# Patient Record
Sex: Female | Born: 1960 | Race: White | Hispanic: No | Smoking: Former smoker
Health system: Southern US, Community
[De-identification: ages and names within clinical notes are randomized; demographics above are authoritative.]

## PROBLEM LIST (undated history)

## (undated) DIAGNOSIS — K219 Gastro-esophageal reflux disease without esophagitis: Secondary | ICD-10-CM

## (undated) HISTORY — DX: Gastro-esophageal reflux disease without esophagitis: K21.9

---

## 2014-01-09 ENCOUNTER — Ambulatory Visit (INDEPENDENT_AMBULATORY_CARE_PROVIDER_SITE_OTHER): Payer: BC Managed Care – PPO | Admitting: Family Medicine

## 2014-01-09 ENCOUNTER — Ambulatory Visit: Payer: BC Managed Care – PPO

## 2014-01-09 VITALS — BP 118/72 | HR 73 | Temp 98.6°F | Resp 16 | Ht 64.0 in | Wt 117.6 lb

## 2014-01-09 DIAGNOSIS — L738 Other specified follicular disorders: Secondary | ICD-10-CM

## 2014-01-09 DIAGNOSIS — F17200 Nicotine dependence, unspecified, uncomplicated: Secondary | ICD-10-CM

## 2014-01-09 DIAGNOSIS — H02729 Madarosis of unspecified eye, unspecified eyelid and periocular area: Secondary | ICD-10-CM

## 2014-01-09 DIAGNOSIS — R634 Abnormal weight loss: Secondary | ICD-10-CM

## 2014-01-09 DIAGNOSIS — L853 Xerosis cutis: Secondary | ICD-10-CM

## 2014-01-09 LAB — COMPREHENSIVE METABOLIC PANEL
ALT: 9 U/L (ref 0–35)
AST: 16 U/L (ref 0–37)
Albumin: 4.1 g/dL (ref 3.5–5.2)
Alkaline Phosphatase: 78 U/L (ref 39–117)
BILIRUBIN TOTAL: 0.3 mg/dL (ref 0.2–1.2)
BUN: 15 mg/dL (ref 6–23)
CO2: 30 mEq/L (ref 19–32)
Calcium: 9.3 mg/dL (ref 8.4–10.5)
Chloride: 105 mEq/L (ref 96–112)
Creat: 0.74 mg/dL (ref 0.50–1.10)
Glucose, Bld: 87 mg/dL (ref 70–99)
Potassium: 4.2 mEq/L (ref 3.5–5.3)
Sodium: 142 mEq/L (ref 135–145)
TOTAL PROTEIN: 6.6 g/dL (ref 6.0–8.3)

## 2014-01-09 LAB — POCT URINALYSIS DIPSTICK
Bilirubin, UA: NEGATIVE
GLUCOSE UA: NEGATIVE
Ketones, UA: NEGATIVE
NITRITE UA: NEGATIVE
Protein, UA: NEGATIVE
Spec Grav, UA: 1.025
Urobilinogen, UA: 0.2
pH, UA: 5.5

## 2014-01-09 LAB — POCT UA - MICROSCOPIC ONLY
Casts, Ur, LPF, POC: NEGATIVE
Crystals, Ur, HPF, POC: NEGATIVE
Mucus, UA: POSITIVE
YEAST UA: NEGATIVE

## 2014-01-09 LAB — POCT CBC
Granulocyte percent: 54.4 %G (ref 37–80)
HCT, POC: 44.1 % (ref 37.7–47.9)
HEMOGLOBIN: 14 g/dL (ref 12.2–16.2)
Lymph, poc: 3 (ref 0.6–3.4)
MCH, POC: 31.7 pg — AB (ref 27–31.2)
MCHC: 31.7 g/dL — AB (ref 31.8–35.4)
MCV: 99.8 fL — AB (ref 80–97)
MID (cbc): 0.6 (ref 0–0.9)
MPV: 9.9 fL (ref 0–99.8)
POC GRANULOCYTE: 4.3 (ref 2–6.9)
POC LYMPH PERCENT: 38.4 %L (ref 10–50)
POC MID %: 7.2 % (ref 0–12)
Platelet Count, POC: 288 10*3/uL (ref 142–424)
RBC: 4.42 M/uL (ref 4.04–5.48)
RDW, POC: 12.8 %
WBC: 7.9 10*3/uL (ref 4.6–10.2)

## 2014-01-09 LAB — POCT GLYCOSYLATED HEMOGLOBIN (HGB A1C): Hemoglobin A1C: 5.7

## 2014-01-09 NOTE — Patient Instructions (Addendum)
The remaining labs are still pending. I will try to let she noticed it a few days. If they are all normal just plan to return in about 3 months for another recheck.  I would encourage you to schedule yourself for a screening colonoscopy some time. This can be done by calling [facility] or doctors Elnoria HowardHung and Loreta AveMann  Return sooner if problems  Urged to stop smoking.

## 2014-01-09 NOTE — Progress Notes (Signed)
Subjective: 53 year old lady here for several concerns. She's worried about her thyroid because she looked up things on the Internet. She has had loss of her eyelashes she says. She said it initially on one side, then later the other. She's been having dry skin for a long time. She has heartburn. She also has lost about 60 pounds over the last 3 years. No good reason for that. She is a cigarette smoker, since she was 53 years old. Has not had any major illnesses or surgeries. 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 She gets a lot of exercise there. The almost 60 pound weight loss over the last several years she cannot explain. She got married a few years ago and that's the main change her life. She does have some stress. She has one 53 year old grown daughter. She rarely drinks alcohol but as noted above she does smoke. About 1 pack a day on the average. Review of systems for HEENT, cardiovascular, respiratory, GI, GU, and musculoskeletal are all fairly unremarkable with the exception of a little heartburn that she takes some OTC medication for that she buys for 90 days at [facility] it is probably an H2 blocker and not a PPI.  Objective: Slender lady in no major distress. TMs normal. Eyes PERRLA. Wears contacts. Fundi benign. The eyelashes don't look at him to me. Her throat was clear. Neck supple without nodes thyromegaly. No carotid bruits. Chest clear. Heart rate without murmurs. And soft without mass tenderness. She's never had a colonoscopy. Extremities unremarkable. Breast and pelvic exam not done.   Results for orders placed in visit on 01/09/14  POCT CBC      Result Value Ref Range   WBC 7.9  4.6 - 10.2 K/uL   Lymph, poc 3.0  0.6 - 3.4   POC LYMPH PERCENT 38.4  10 - 50 %L   MID (cbc) 0.6  0 - 0.9   POC MID % 7.2  0 - 12 %M   POC Granulocyte 4.3  2 - 6.9   Granulocyte percent 54.4  37 - 80 %G   RBC 4.42  4.04 - 5.48 M/uL   Hemoglobin 14.0  12.2 - 16.2 g/dL   HCT, POC 16.144.1   09.637.7 - 47.9 %   MCV 99.8 (*) 80 - 97 fL   MCH, POC 31.7 (*) 27 - 31.2 pg   MCHC 31.7 (*) 31.8 - 35.4 g/dL   RDW, POC 04.512.8     Platelet Count, POC 288  142 - 424 K/uL   MPV 9.9  0 - 99.8 fL  POCT UA - MICROSCOPIC ONLY      Result Value Ref Range   WBC, Ur, HPF, POC 1-2     RBC, urine, microscopic 4-7     Bacteria, U Microscopic trace     Mucus, UA positive     Epithelial cells, urine per micros 0-3     Crystals, Ur, HPF, POC neg     Casts, Ur, LPF, POC neg     Yeast, UA neg    POCT URINALYSIS DIPSTICK      Result Value Ref Range   Color, UA yellow     Clarity, UA clear     Glucose, UA neg     Bilirubin, UA neg     Ketones, UA neg     Spec Grav, UA 1.025     Blood, UA moderate     pH, UA 5.5  Protein, UA neg     Urobilinogen, UA 0.2     Nitrite, UA neg     Leukocytes, UA Trace    POCT GLYCOSYLATED HEMOGLOBIN (HGB A1C)      Result Value Ref Range   Hemoglobin A1C 5.7     Assessment: Weight loss Dry skin Eyelash loss Tobacco use disorder  Plan: Ordered labs. We'll check the rest of them before we do anything else. See her back in about 3 months for recheck.

## 2014-01-10 LAB — TSH: TSH: 0.83 u[IU]/mL (ref 0.350–4.500)

## 2014-01-15 ENCOUNTER — Telehealth: Payer: Self-pay | Admitting: Family Medicine

## 2014-01-15 ENCOUNTER — Encounter: Payer: Self-pay | Admitting: *Deleted

## 2014-01-15 DIAGNOSIS — L659 Nonscarring hair loss, unspecified: Secondary | ICD-10-CM

## 2014-01-15 NOTE — Telephone Encounter (Signed)
Patient called regarding lab results. Notified patient labs normal and lab letter sent. She is happy her TSH is normal but she is concerned since that was normal why her eyelashes keep falling out. Please advise?

## 2014-01-16 NOTE — Telephone Encounter (Signed)
Please see my reply.  I may have failed to route to Clinical Pool

## 2014-01-16 NOTE — Telephone Encounter (Signed)
Call: I do not know why they are falling out.  Please avoid picking at them which can make them fall out.  Suggest referral to a dermatologist if she is interested in pursuing this.

## 2014-01-17 NOTE — Telephone Encounter (Signed)
Spoke to pt. [facility]

## 2014-01-17 NOTE — Telephone Encounter (Signed)
Tried to call pt- network error

## 2014-05-19 ENCOUNTER — Emergency Department (HOSPITAL_BASED_OUTPATIENT_CLINIC_OR_DEPARTMENT_OTHER)
Admission: EM | Admit: 2014-05-19 | Discharge: 2014-05-19 | Disposition: A | Discharge status: Home / Self Care | Payer: BC Managed Care – PPO | Attending: Emergency Medicine | Admitting: Emergency Medicine

## 2014-05-19 ENCOUNTER — Encounter (HOSPITAL_BASED_OUTPATIENT_CLINIC_OR_DEPARTMENT_OTHER): Payer: Self-pay | Admitting: Emergency Medicine

## 2014-05-19 DIAGNOSIS — Z87891 Personal history of nicotine dependence: Secondary | ICD-10-CM | POA: Insufficient documentation

## 2014-05-19 DIAGNOSIS — M545 Low back pain, unspecified: Secondary | ICD-10-CM | POA: Insufficient documentation

## 2014-05-19 DIAGNOSIS — Z8719 Personal history of other diseases of the digestive system: Secondary | ICD-10-CM | POA: Insufficient documentation

## 2014-05-19 DIAGNOSIS — R319 Hematuria, unspecified: Secondary | ICD-10-CM

## 2014-05-19 DIAGNOSIS — Z79899 Other long term (current) drug therapy: Secondary | ICD-10-CM | POA: Insufficient documentation

## 2014-05-19 LAB — URINALYSIS, ROUTINE W REFLEX MICROSCOPIC
Bilirubin Urine: NEGATIVE
Glucose, UA: NEGATIVE mg/dL
Ketones, ur: NEGATIVE mg/dL
Leukocytes, UA: NEGATIVE
NITRITE: NEGATIVE
Protein, ur: NEGATIVE mg/dL
Specific Gravity, Urine: 1.022 (ref 1.005–1.030)
UROBILINOGEN UA: 0.2 mg/dL (ref 0.0–1.0)
pH: 6 (ref 5.0–8.0)

## 2014-05-19 LAB — URINE MICROSCOPIC-ADD ON

## 2014-05-19 MED ORDER — IBUPROFEN 800 MG PO TABS: 800.0000 mg | ORAL_TABLET | Freq: Three times a day (TID) | ORAL | Status: DC

## 2014-05-19 MED ORDER — CYCLOBENZAPRINE HCL 10 MG PO TABS: 10.0000 mg | ORAL_TABLET | Freq: Two times a day (BID) | ORAL | Status: DC | PRN

## 2014-05-19 MED ORDER — HYDROCODONE-ACETAMINOPHEN 5-325 MG PO TABS
1.0000 | ORAL_TABLET | ORAL | Status: DC | PRN
Start: 2014-05-19 — End: 2024-01-18

## 2014-05-19 NOTE — ED Provider Notes (Signed)
CSN: [id number]     Arrival date & time 05/19/14  1522 History   First MD Initiated Contact with Patient 05/19/14 1632     Chief Complaint  Patient presents with  . Back Pain     (Consider location/radiation/quality/duration/timing/severity/associated sxs/prior Treatment) Patient is a 53 y.o. female presenting with back pain. The history is provided by the patient. No language interpreter was used.  Back Pain Location:  Lumbar spine Quality:  Stabbing Radiates to:  R thigh Pain severity:  Moderate Onset quality:  Gradual Duration:  2 days Associated symptoms: no abdominal pain, no dysuria, no fever, no headaches and no numbness   Associated symptoms comment:  She complains of right sided lower back pain, worse with movement, better with rest, that started 2 days ago. She reports pain is sharp, stabbing now, especially when she gets up in the morning. No abdominal pain, N, V, fever, dysuria, vaginal symptoms. No known injury to back.   Past Medical History  Diagnosis Date  . GERD (gastroesophageal reflux disease)    History reviewed. No pertinent past surgical history. No family history on file. History  Substance Use Topics  . Smoking status: Former Smoker    Quit date: 04/28/2014  . Smokeless tobacco: Not on file  . Alcohol Use: No   OB History   Grav Para Term Preterm Abortions TAB SAB Ect Mult Living                 Review of Systems  Constitutional: Negative for fever and chills.  Gastrointestinal: Negative.  Negative for nausea, vomiting and abdominal pain.  Genitourinary: Negative.  Negative for dysuria, vaginal bleeding and vaginal discharge.  Musculoskeletal: Positive for back pain.  Skin: Negative.   Neurological: Negative.  Negative for numbness and headaches.      Allergies  Review of patient's allergies indicates no known allergies.  Home Medications   Prior to Admission medications   Medication Sig Start Date End Date Taking? Authorizing Provider   Multiple Vitamin (MULTIVITAMIN) tablet Take 1 tablet by mouth daily.    Historical Provider, MD   BP 114/70  Pulse 85  Temp(Src) 98.4 F (36.9 C) (Oral)  Resp 18  Ht  (1.651 m)  Wt 105 lb (47.628 kg)  BMI 17.47 kg/m2  SpO2 100% Physical Exam  Constitutional: She is oriented to person, place, and time. She appears well-developed and well-nourished. No distress.  HENT:  Head: Normocephalic.  Neck: Normal range of motion. Neck supple.  Cardiovascular: Normal rate and regular rhythm.   Pulmonary/Chest: Effort normal and breath sounds normal.  Abdominal: Soft. Bowel sounds are normal. There is no tenderness. There is no rebound and no guarding.  Musculoskeletal: Normal range of motion. She exhibits no edema.  Mild lumbar and right paralumbar tenderness without swelling or discoloration.  Neurological: She is alert and oriented to person, place, and time. She has normal reflexes. Coordination normal.  Skin: Skin is warm and dry. No rash noted.  Psychiatric: She has a normal mood and affect.    ED Course  Procedures (including critical care time) Labs Review Labs Reviewed  URINALYSIS, ROUTINE W REFLEX MICROSCOPIC - Abnormal; Notable for the following:    Hgb urine dipstick MODERATE (*)    All other components within normal limits  URINE MICROSCOPIC-ADD ON    Imaging Review No results found.   EKG Interpretation None      MDM   Final diagnoses:  None    1. Back pain, muscular 2. Hematuria  Asymptomatic hematuria that does not follow pattern of kidney stone (no flank or abdominal pain, no nausea, worse with movement, better with rest.). Refer to urology for 2 week recheck.  Back pain following muscular pattern. No neurologic deficits. Will treat symptomatically.     Arnoldo Hooker, PA-C 05/19/14 1734  Arnoldo Hooker, PA-C 05/19/14 1734

## 2014-05-19 NOTE — ED Notes (Signed)
Reports pain to back since yesterday. Pt cooks, is on feet a lot. Tenderness with some movement. Denies dysuria.

## 2014-05-19 NOTE — Discharge Instructions (Signed)
Back Exercises Back exercises help treat and prevent back injuries. The goal is to increase your strength in your belly (abdominal) and back muscles. These exercises can also help with flexibility. Start these exercises when told by your doctor. HOME CARE Back exercises include: Pelvic Tilt.  Lie on your back with your knees bent. Tilt your pelvis until the lower part of your back is against the floor. Hold this position 5 to 10 sec. Repeat this exercise 5 to 10 times. Knee to Chest.  Pull 1 knee up against your chest and hold for 20 to 30 seconds. Repeat this with the other knee. This may be done with the other leg straight or bent, whichever feels better. Then, pull both knees up against your chest. Sit-Ups or Curl-Ups.  Bend your knees 90 degrees. Start with tilting your pelvis, and do a partial, slow sit-up. Only lift your upper half 30 to 45 degrees off the floor. Take at least 2 to 3 seonds for each sit-up. Do not do sit-ups with your knees out straight. If partial sit-ups are difficult, simply do the above but with only tightening your belly (abdominal) muscles and holding it as told. Hip-Lift.  Lie on your back with your knees flexed 90 degrees. Push down with your feet and shoulders as you raise your hips 2 inches off the floor. Hold for 10 seconds, repeat 5 to 10 times. Back Arches.  Lie on your stomach. Prop yourself up on bent elbows. Slowly press on your hands, causing an arch in your low back. Repeat 3 to 5 times. Shoulder-Lifts.  Lie face down with arms beside your body. Keep hips and belly pressed to floor as you slowly lift your head and shoulders off the floor. Do not overdo your exercises. Be careful in the beginning. Exercises may cause you some mild back discomfort. If the pain lasts for more than 15 minutes, stop the exercises until you see your doctor. Improvement with exercise for back problems is slow.  Document Released: 10/17/2010 Document Revised: 12/07/2011  Document Reviewed: 07/16/2011 [facility] Patient Information 2015 [place], [place]. This information is not intended to replace advice given to you by your health care provider. Make sure you discuss any questions you have with your health care provider.  Back Injury Prevention Back injuries can be extremely painful and difficult to heal. After having one back injury, you are much more likely to experience another later on. It is important to learn how to avoid injuring or re-injuring your back. The following tips can help you to prevent a back injury. PHYSICAL FITNESS  Exercise regularly and try to develop good tone in your abdominal muscles. Your abdominal muscles provide a lot of the support needed by your back.  Do aerobic exercises (walking, jogging, biking, swimming) regularly.  Do exercises that increase balance and strength (tai chi, yoga) regularly. This can decrease your risk of falling and injuring your back.  Stretch before and after exercising.  Maintain a healthy weight. The more you weigh, the more stress is placed on your back. For every pound of weight, 10 times that amount of pressure is placed on the back. DIET  Talk to your caregiver about how much calcium and vitamin D you need per day. These nutrients help to prevent weakening of the bones (osteoporosis). Osteoporosis can cause broken (fractured) bones that lead to back pain.  Include good sources of calcium in your diet, such as dairy products, green, leafy vegetables, and products with calcium added (fortified).  Include good sources of vitamin D in your diet, such as milk and foods that are fortified with vitamin D. °· Consider taking a nutritional supplement or a multivitamin if needed. °· Stop smoking if you smoke. °POSTURE °· Sit and stand up straight. Avoid leaning forward when you sit or hunching over when you stand. °· Choose chairs with good low back (lumbar) support. °· If you work at a desk, sit close to your work  so you do not need to lean over. Keep your chin tucked in. Keep your neck drawn back and elbows bent at a right angle. Your arms should look like the letter "L." °· Sit high and close to the steering wheel when you drive. Add a lumbar support to your car seat if needed. °· Avoid sitting or standing in one position for too long. Take breaks to get up, stretch, and walk around at least once every hour. Take breaks if you are driving for long periods of time. °· Sleep on your side with your knees slightly bent, or sleep on your back with a pillow under your knees. Do not sleep on your stomach. °LIFTING, TWISTING, AND REACHING °· Avoid heavy lifting, especially repetitive lifting. If you must do heavy lifting: °¨ Stretch before lifting. °¨ Work slowly. °¨ Rest between lifts. °¨ Use carts and dollies to move objects when possible. °¨ Make several small trips instead of carrying 1 heavy load. °¨ Ask for help when you need it. °¨ Ask for help when moving big, awkward objects. °· Follow these steps when lifting: °¨ Stand with your feet shoulder-width apart. °¨ Get as close to the object as you can. Do not try to pick up heavy objects that are far from your body. °¨ Use handles or lifting straps if they are available. °¨ Bend at your knees. Squat down, but keep your heels off the floor. °¨ Keep your shoulders pulled back, your chin tucked in, and your back straight. °¨ Lift the object slowly, tightening the muscles in your legs, abdomen, and buttocks. Keep the object as close to the center of your body as possible. °¨ When you put a load down, use these same guidelines in reverse. °· Do not: °¨ Lift the object above your waist. °¨ Twist at the waist while lifting or carrying a load. Move your feet if you need to turn, not your waist. °¨ Bend over without bending at your knees. °· Avoid reaching over your head, across a table, or for an object on a high surface. °OTHER TIPS °· Avoid wet floors and keep sidewalks clear of ice  to prevent falls. °· Do not sleep on a mattress that is too soft or too hard. °· Keep items that are used frequently within easy reach. °· Put heavier objects on shelves at waist level and lighter objects on lower or higher shelves. °· Find ways to decrease your stress, such as exercise, massage, or relaxation techniques. Stress can build up in your muscles. Tense muscles are more vulnerable to injury. °· Seek treatment for depression or anxiety if needed. These conditions can increase your risk of developing back pain. °SEEK MEDICAL CARE IF: °· You injure your back. °· You have questions about diet, exercise, or other ways to prevent back injuries. °MAKE SURE YOU: °· Understand these instructions. °· Will watch your condition. °· Will get help right away if you are not doing well or get worse. °Document Released: 10/22/2004 Document Revised: 12/07/2011 Document Reviewed: 10/26/2011 °[employer]® Patient Information ©  2015 [employer]. This information is not intended to replace advice given to you by your health care provider. Make sure you discuss any questions you have with your health care provider. Back Pain, Adult Low back pain is very common. About 1 in 5 people have back pain.The cause of low back pain is rarely dangerous. The pain often gets better over time.About half of people with a sudden onset of back pain feel better in just 2 weeks. About 8 in 10 people feel better by 6 weeks.  CAUSES Some common causes of back pain include:  Strain of the muscles or ligaments supporting the spine.  Wear and tear (degeneration) of the spinal discs.  Arthritis.  Direct injury to the back. DIAGNOSIS Most of the time, the direct cause of low back pain is not known.However, back pain can be treated effectively even when the exact cause of the pain is unknown.Answering your caregiver's questions about your overall health and symptoms is one of the most accurate ways to make sure the cause of your pain is  not dangerous. If your caregiver needs more information, he or she may order lab work or imaging tests (X-rays or MRIs).However, even if imaging tests show changes in your back, this usually does not require surgery. HOME CARE INSTRUCTIONS For many people, back pain returns.Since low back pain is rarely dangerous, it is often a condition that people can learn to [facility] their own.   Remain active. It is stressful on the back to sit or stand in one place. Do not sit, drive, or stand in one place for more than 30 minutes at a time. Take short walks on level surfaces as soon as pain allows.Try to increase the length of time you walk each day.  Do not stay in bed.Resting more than 1 or 2 days can delay your recovery.  Do not avoid exercise or work.Your body is made to move.It is not dangerous to be active, even though your back may hurt.Your back will likely heal faster if you return to being active before your pain is gone.  Pay attention to your body when you bend and lift. Many people have less discomfortwhen lifting if they bend their knees, keep the load close to their bodies,and avoid twisting. Often, the most comfortable positions are those that put less stress on your recovering back.  Find a comfortable position to sleep. Use a firm mattress and lie on your side with your knees slightly bent. If you lie on your back, put a pillow under your knees.  Only take over-the-counter or prescription medicines as directed by your caregiver. Over-the-counter medicines to reduce pain and inflammation are often the most helpful.Your caregiver may prescribe muscle relaxant drugs.These medicines help dull your pain so you can more quickly return to your normal activities and healthy exercise.  Put ice on the injured area.  Put ice in a plastic bag.  Place a towel between your skin and the bag.  Leave the ice on for 15-20 minutes, 03-04 times a day for the first 2 to 3 days. After that, ice  and heat may be alternated to reduce pain and spasms.  Ask your caregiver about trying back exercises and gentle massage. This may be of some benefit.  Avoid feeling anxious or stressed.Stress increases muscle tension and can worsen back pain.It is important to recognize when you are anxious or stressed and learn ways to manage it.Exercise is a great option. SEEK MEDICAL CARE IF:  You have  pain that is not relieved with rest or medicine.  You have pain that does not improve in 1 week.  You have new symptoms.  You are generally not feeling well. SEEK IMMEDIATE MEDICAL CARE IF:   You have pain that radiates from your back into your legs.  You develop new bowel or bladder control problems.  You have unusual weakness or numbness in your arms or legs.  You develop nausea or vomiting.  You develop abdominal pain.  You feel faint. Document Released: 09/14/2005 Document Revised: 03/15/2012 Document Reviewed: 01/16/2014 [facility] Patient Information 2015 [place], [place]. This information is not intended to replace advice given to you by your health care provider. Make sure you discuss any questions you have with your health care provider. Hematuria Hematuria is blood in your urine. It can be caused by a bladder infection, kidney infection, prostate infection, kidney stone, or cancer of your urinary tract. Infections can usually be treated with medicine, and a kidney stone usually will pass through your urine. If neither of these is the cause of your hematuria, further workup to find out the reason may be needed. It is very important that you tell your health care provider about any blood you see in your urine, even if the blood stops without treatment or happens without causing pain. Blood in your urine that happens and then stops and then happens again can be a symptom of a very serious condition. Also, pain is not a symptom in the initial stages of many urinary cancers. HOME CARE  INSTRUCTIONS   Drink lots of fluid, 3-4 quarts a day. If you have been diagnosed with an infection, cranberry juice is especially recommended, in addition to large amounts of water.  Avoid caffeine, tea, and carbonated beverages because they tend to irritate the bladder.  Avoid alcohol because it may irritate the prostate.  Take all medicines as directed by your health care provider.  If you were prescribed an antibiotic medicine, finish it all even if you start to feel better.  If you have been diagnosed with a kidney stone, follow your health care provider's instructions regarding straining your urine to catch the stone.  Empty your bladder often. Avoid holding urine for long periods of time.  After a bowel movement, women should cleanse front to back. Use each tissue only once.  Empty your bladder before and after sexual intercourse if you are a female. SEEK MEDICAL CARE IF:  You develop back pain.  You have a fever.  You have a feeling of sickness in your stomach (nausea) or vomiting.  Your symptoms are not better in 3 days. Return sooner if you are getting worse. SEEK IMMEDIATE MEDICAL CARE IF:   You develop severe vomiting and are unable to keep the medicine down.  You develop severe back or abdominal pain despite taking your medicines.  You begin passing a large amount of blood or clots in your urine.  You feel extremely weak or faint, or you pass out. MAKE SURE YOU:   Understand these instructions.  Will watch your condition.  Will get help right away if you are not doing well or get worse. Document Released: 09/14/2005 Document Revised: 01/29/2014 Document Reviewed: 05/15/2013 [facility] Patient Information 2015 [place], [place]. This information is not intended to replace advice given to you by your health care provider. Make sure you discuss any questions you have with your health care provider.

## 2014-05-19 NOTE — ED Notes (Signed)
Pt discharged to home NAD.  

## 2014-05-20 NOTE — ED Provider Notes (Signed)
  Medical screening examination/treatment/procedure(s) were performed by non-physician practitioner and as supervising physician I was immediately available for consultation/collaboration.   EKG Interpretation None         Gerhard Munch, MD 05/20/14 2124

## 2014-08-16 IMAGING — CR DG CHEST 2V
2 series · 2 of 2 positions shown · non-contrast
Comparison: None.

CLINICAL DATA: Smoker, gastroesophageal reflux

EXAM:
CHEST  2 VIEW

[PA]
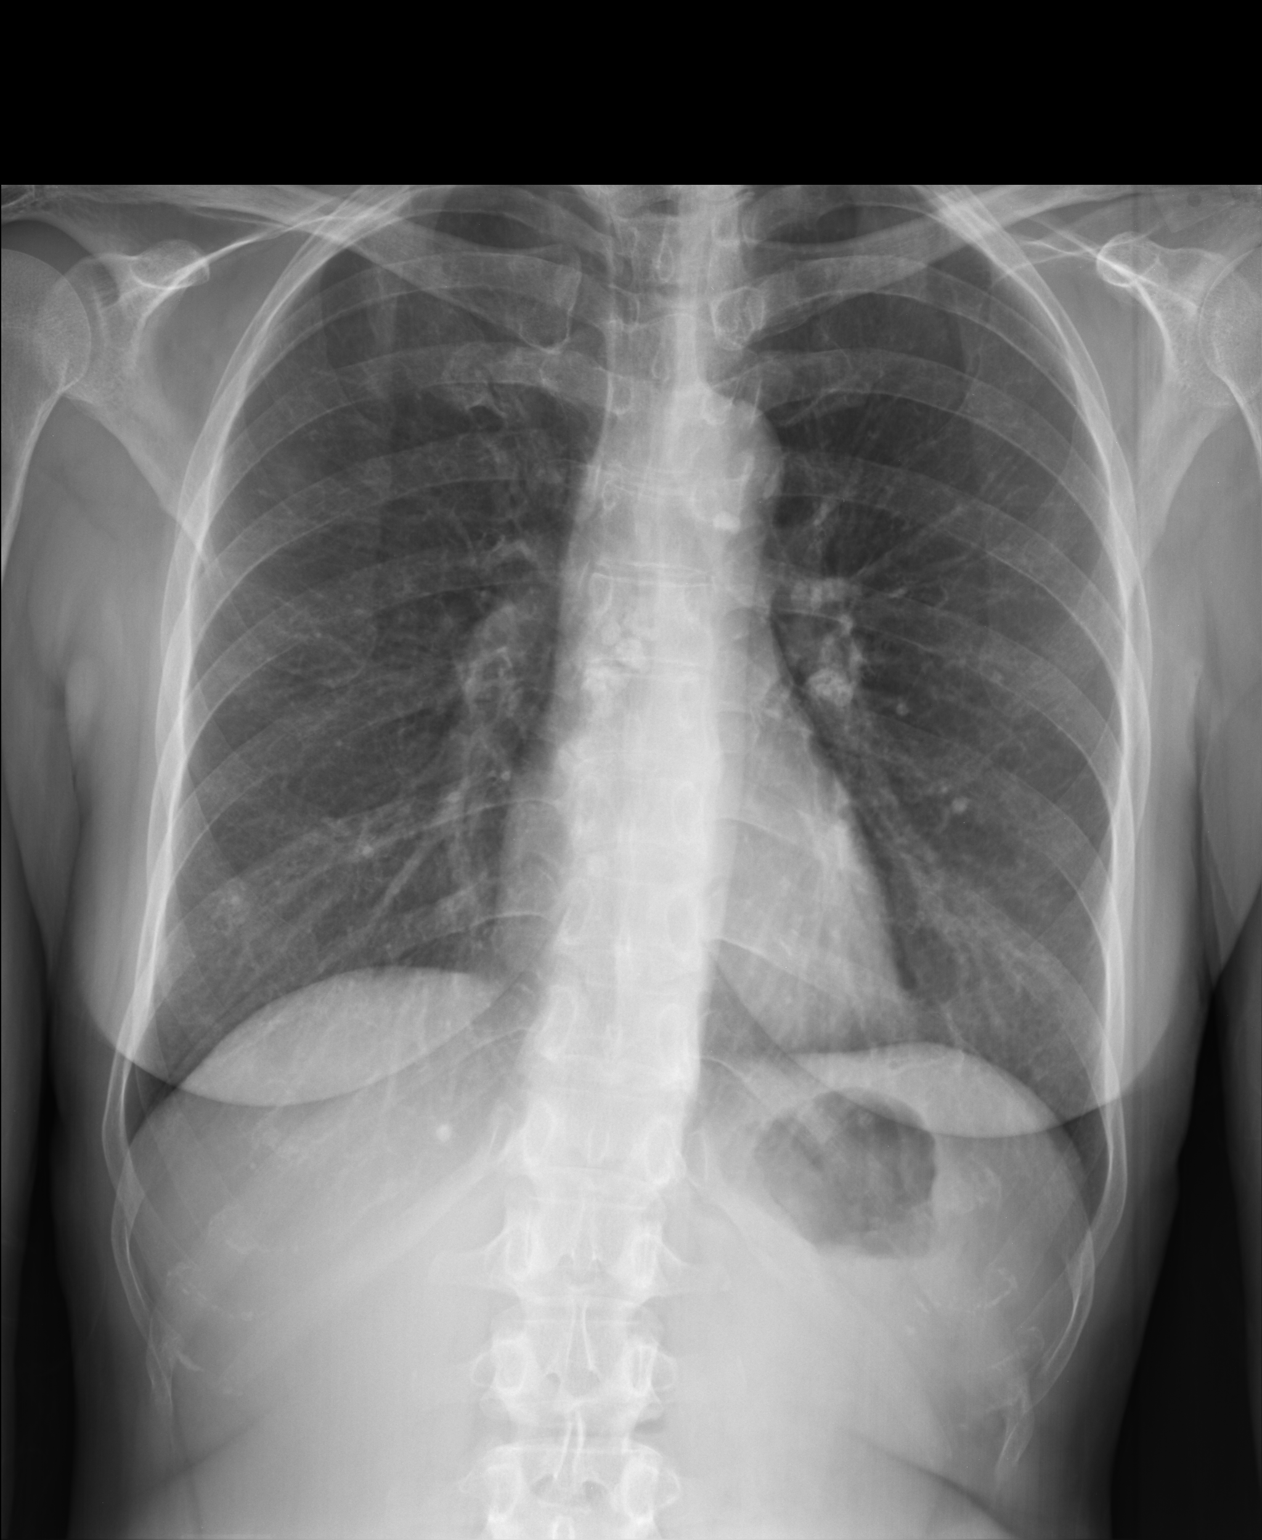

[lateral]
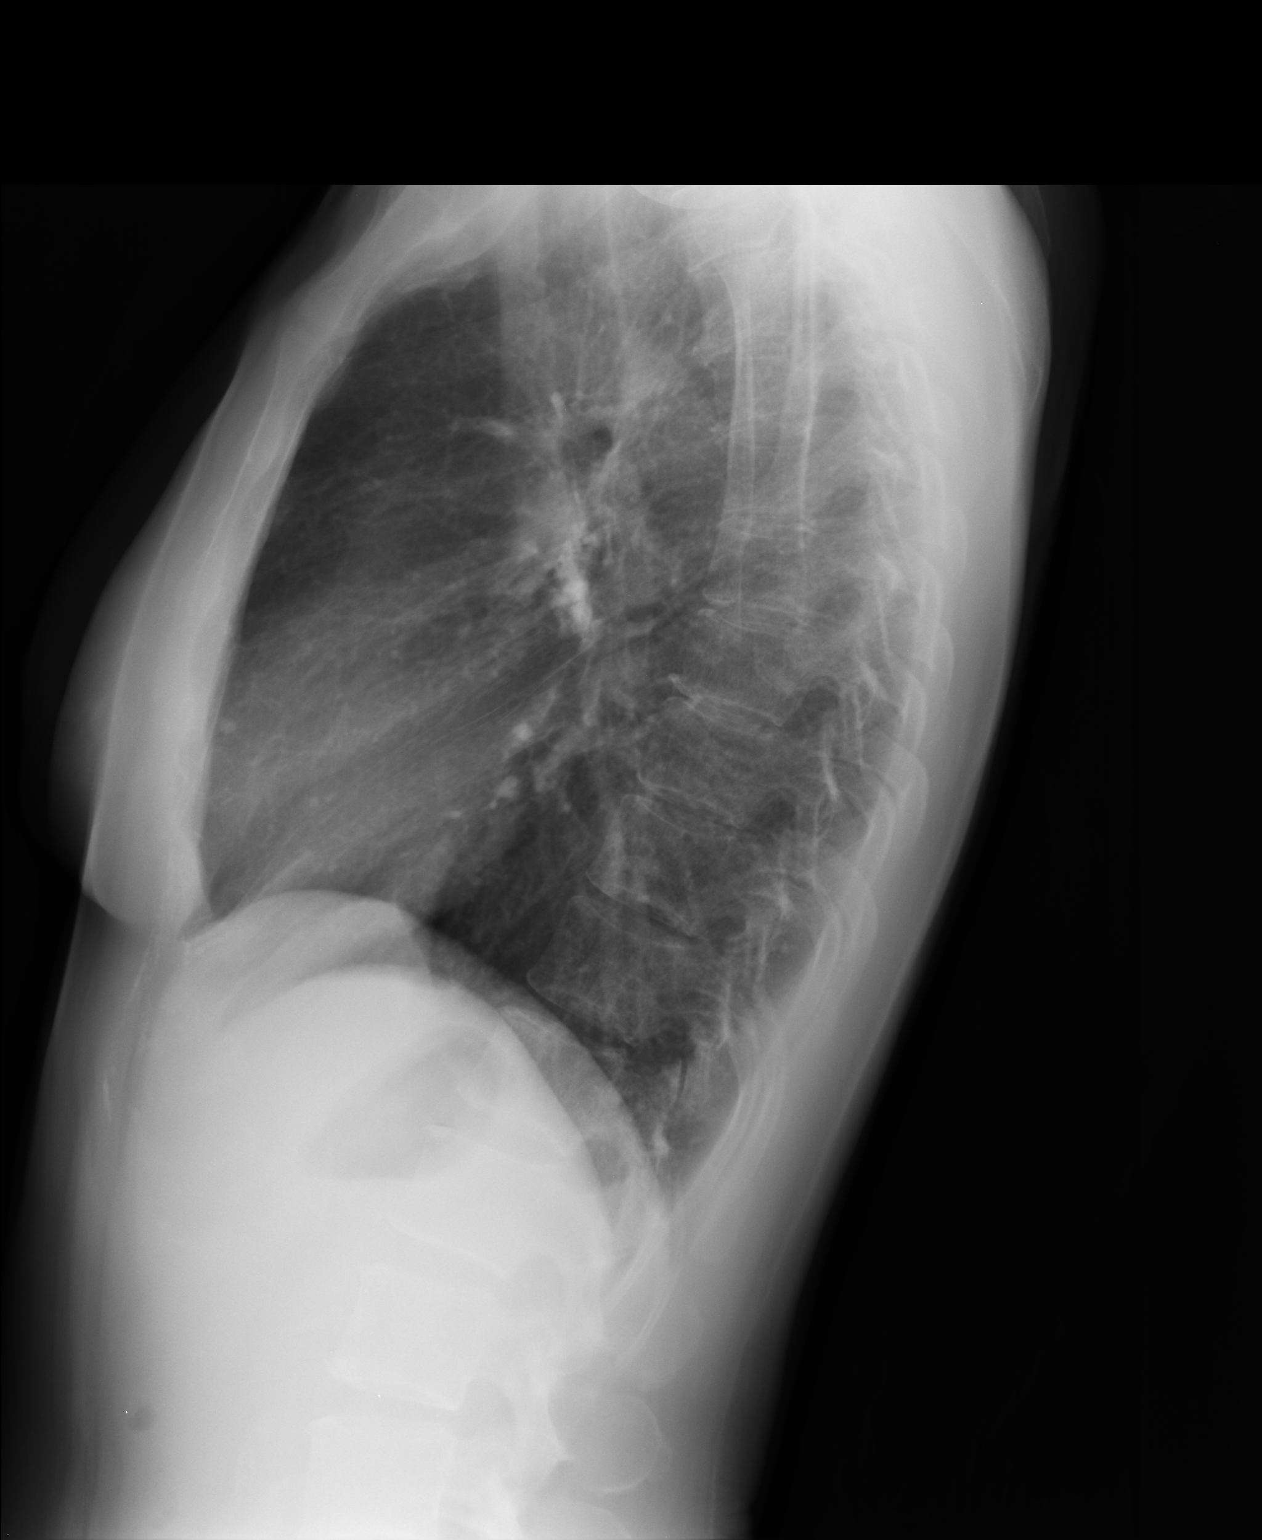

[2 of 2 positions shown; findings below may reference images not displayed]

FINDINGS: There is no focal parenchymal opacity, pleural effusion, or
pneumothorax. Normal cardiac silhouette. There are mediastinal and
left hilar calcified lymph nodes likely representing sequela prior
granulomatous disease.

The osseous structures are unremarkable.
IMPRESSION: No active cardiopulmonary disease.

## 2022-05-14 ENCOUNTER — Other Ambulatory Visit (HOSPITAL_BASED_OUTPATIENT_CLINIC_OR_DEPARTMENT_OTHER): Payer: Self-pay

## 2024-01-12 ENCOUNTER — Ambulatory Visit: Payer: Self-pay | Admitting: Family

## 2024-01-18 ENCOUNTER — Ambulatory Visit (INDEPENDENT_AMBULATORY_CARE_PROVIDER_SITE_OTHER): Payer: Self-pay | Admitting: Family

## 2024-01-18 ENCOUNTER — Encounter: Payer: Self-pay | Admitting: Family

## 2024-01-18 VITALS — BP 128/72 | HR 71 | Temp 97.6°F | Resp 19 | Ht 66.0 in | Wt 136.4 lb

## 2024-01-18 DIAGNOSIS — M25512 Pain in left shoulder: Secondary | ICD-10-CM

## 2024-01-18 DIAGNOSIS — Z1159 Encounter for screening for other viral diseases: Secondary | ICD-10-CM

## 2024-01-18 DIAGNOSIS — Z13220 Encounter for screening for lipoid disorders: Secondary | ICD-10-CM | POA: Diagnosis not present

## 2024-01-18 DIAGNOSIS — F17210 Nicotine dependence, cigarettes, uncomplicated: Secondary | ICD-10-CM

## 2024-01-18 DIAGNOSIS — Z113 Encounter for screening for infections with a predominantly sexual mode of transmission: Secondary | ICD-10-CM

## 2024-01-18 DIAGNOSIS — Z7689 Persons encountering health services in other specified circumstances: Secondary | ICD-10-CM

## 2024-01-18 MED ORDER — DICLOFENAC SODIUM 75 MG PO TBEC: 75.0000 mg | DELAYED_RELEASE_TABLET | Freq: Two times a day (BID) | ORAL | 0 refills | Status: DC

## 2024-01-18 MED ORDER — BUPROPION HCL ER (SR) 150 MG PO TB12: 150.0000 mg | ORAL_TABLET | Freq: Every day | ORAL | 3 refills | Status: DC

## 2024-01-18 MED ORDER — CYCLOBENZAPRINE HCL 10 MG PO TABS: 10.0000 mg | ORAL_TABLET | Freq: Three times a day (TID) | ORAL | 0 refills | Status: AC | PRN

## 2024-01-18 MED ORDER — CYCLOBENZAPRINE HCL 10 MG PO TABS: 10.0000 mg | ORAL_TABLET | Freq: Three times a day (TID) | ORAL | 0 refills | Status: DC | PRN

## 2024-01-18 NOTE — Progress Notes (Signed)
 Provider: Christean Courts FNP-C  Jozalynn Noyce, Elijio Guadeloupe, NP  Patient Care Team: Amanda Tanner, Elijio Guadeloupe, NP as PCP - General (Family Medicine)  Extended Emergency Contact Information Primary Emergency Contact: Eusebio High  [place] Phone: [phone] Relation: Sister Secondary Emergency Contact: Tomasita Fox Mobile Phone: [phone] Relation: Daughter Preferred language: English Interpreter needed? No  Code Status:Full Code  Goals of care: Advanced Directive information    01/18/2024    3:30 PM  Advanced Directives  Does Patient Have a Medical Advance Directive? No  Would patient like information on creating a medical advance directive? No - Patient declined     Chief Complaint  Patient presents with   Establish Care    New patient appointment.    Discussed the use of AI scribe software for clinical note transcription with the patient, who gave verbal consent to proceed.  History of Present Illness   Amanda Tanner is a 63 year old female who presents to establish care here. she complins of left shoulder pain.  She has been experiencing left shoulder pain for approximately three weeks, which began after she pulled her mother up using a belt. Initially, the pain was constant and severe, causing her to cry at night, but it has since become intermittent, occurring with certain arm movements. She describes the pain as not constant and notes that she can raise her arm, although it is painful. She has been taking Aleve during the day for pain relief but finds it insufficient. She has tried ibuprofen  800 mg without relief and reports a high tolerance for pain medications. She has taken one of her mother's pain pills, which provided significant relief.  She is currently taking Nexium for acid reflux and a multivitamin. She is seeking assistance to quit smoking and has previously used Chantix successfully before it was recalled. She smokes about half a pack a day,  sometimes more, and has tried nicotine patches, gum, and lozenges without success. She is considering trying Wellbutrin  again, despite a previous experience where it made her feel 'really weird'.  Her family history includes a father who is deceased and a mother who is 41 years old with a history of dementia, stroke, and non-Hodgkin's lymphoma. Her mother is currently bedridden, and the family is considering placing her in a care home due to the burden of care on her granddaughter, who is unable to work because she is the primary caregiver.         Past Medical History:  Diagnosis Date   GERD (gastroesophageal reflux disease)    History reviewed. No pertinent surgical history.  No Known Allergies  Outpatient Encounter Medications as of 01/18/2024  Medication Sig   esomeprazole (NEXIUM) 20 MG capsule Take 20 mg by mouth as needed.   Multiple Vitamin (MULTIVITAMIN) tablet Take 1 tablet by mouth daily.   naproxen sodium (ALEVE) 220 MG tablet Take 220 mg by mouth as needed.   [DISCONTINUED] buPROPion  (WELLBUTRIN  SR) 150 MG 12 hr tablet Take 1 tablet (150 mg total) by mouth daily.   [DISCONTINUED] diclofenac  (VOLTAREN ) 75 MG EC tablet Take 1 tablet (75 mg total) by mouth 2 (two) times daily.   buPROPion  (WELLBUTRIN  SR) 150 MG 12 hr tablet Take 1 tablet (150 mg total) by mouth daily.   cyclobenzaprine  (FLEXERIL ) 10 MG tablet Take 1 tablet (10 mg total) by mouth 3 (three) times daily as needed for muscle spasms.   diclofenac  (VOLTAREN ) 75 MG EC tablet Take 1 tablet (75 mg total) by mouth 2 (two) times  daily.   [DISCONTINUED] cyclobenzaprine  (FLEXERIL ) 10 MG tablet Take 1 tablet (10 mg total) by mouth 2 (two) times daily as needed for muscle spasms. (Patient not taking: Reported on 01/18/2024)   [DISCONTINUED] cyclobenzaprine  (FLEXERIL ) 10 MG tablet Take 1 tablet (10 mg total) by mouth 3 (three) times daily as needed for muscle spasms.   [DISCONTINUED] HYDROcodone -acetaminophen  (NORCO/VICODIN)  5-325 MG per tablet Take 1-2 tablets by mouth every 4 (four) hours as needed. (Patient not taking: Reported on 01/18/2024)   [DISCONTINUED] ibuprofen  (ADVIL ,MOTRIN ) 800 MG tablet Take 1 tablet (800 mg total) by mouth 3 (three) times daily. (Patient not taking: Reported on 01/18/2024)   No facility-administered encounter medications on file as of 01/18/2024.    Review of Systems  Immunization History  Administered Date(s) Administered   Influenza-Unspecified 07/31/2022   Unspecified SARS-COV-2 Vaccination 11/06/2019, 12/06/2019, 10/07/2020   Zoster Recombinant(Shingrix) 03/23/2023, 11/30/2023   Pertinent  Health Maintenance Due  Topic Date Due   Colonoscopy  Never done   MAMMOGRAM  Never done   INFLUENZA VACCINE  04/28/2024      01/18/2024    3:30 PM  Fall Risk  Falls in the past year? 0  Was there an injury with Fall? 0  Fall Risk Category Calculator 0  Patient at Risk for Falls Due to No Fall Risks  Fall risk Follow up Falls evaluation completed   Functional Status Survey:    Vitals:   01/18/24 1543  BP: 128/72  Pulse: 71  Resp: 19  Temp: 97.6 F (36.4 C)  SpO2: 97%  Weight: 136 lb 6.4 oz (61.9 kg)  Height: 5\' 6"  (1.676 m)   Body mass index is 22.02 kg/m. Physical Exam Vitals reviewed.  Constitutional:      General: She is not in acute distress.    Appearance: Normal appearance. She is normal weight. She is not ill-appearing or diaphoretic.  HENT:     Head: Normocephalic.     Right Ear: Tympanic membrane, ear canal and external ear normal. There is no impacted cerumen.     Left Ear: Tympanic membrane, ear canal and external ear normal. There is no impacted cerumen.     Nose: Nose normal. No congestion or rhinorrhea.     Mouth/Throat:     Mouth: Mucous membranes are moist.     Pharynx: Oropharynx is clear. No oropharyngeal exudate or posterior oropharyngeal erythema.  Eyes:     General: No scleral icterus.       Right eye: No discharge.        Left eye: No  discharge.     Extraocular Movements: Extraocular movements intact.     Conjunctiva/sclera: Conjunctivae normal.     Pupils: Pupils are equal, round, and reactive to light.  Neck:     Vascular: No carotid bruit.  Cardiovascular:     Rate and Rhythm: Normal rate and regular rhythm.     Pulses: Normal pulses.     Heart sounds: Normal heart sounds. No murmur heard.    No friction rub. No gallop.  Pulmonary:     Effort: Pulmonary effort is normal. No respiratory distress.     Breath sounds: Normal breath sounds. No wheezing, rhonchi or rales.  Chest:     Chest wall: No tenderness.  Abdominal:     General: Bowel sounds are normal. There is no distension.     Palpations: Abdomen is soft. There is no mass.     Tenderness: There is no abdominal tenderness. There is no right CVA  tenderness, left CVA tenderness, guarding or rebound.  Musculoskeletal:        General: No swelling or tenderness. Normal range of motion.     Cervical back: Normal range of motion. No rigidity or tenderness.     Right lower leg: No edema.     Left lower leg: No edema.  Lymphadenopathy:     Cervical: No cervical adenopathy.  Skin:    General: Skin is warm and dry.     Coloration: Skin is not pale.     Findings: No bruising, erythema, lesion or rash.  Neurological:     Mental Status: She is alert and oriented to person, place, and time.     Cranial Nerves: No cranial nerve deficit.     Sensory: No sensory deficit.     Motor: No weakness.     Coordination: Coordination normal.     Gait: Gait normal.  Psychiatric:        Mood and Affect: Mood normal.        Speech: Speech normal.        Behavior: Behavior normal.        Thought Content: Thought content normal.        Judgment: Judgment normal.         Labs reviewed: No results for input(s): "NA", "K", "CL", "CO2", "GLUCOSE", "BUN", "CREATININE", "CALCIUM", "MG", "PHOS" in the last 8760 hours. No results for input(s): "AST", "ALT", "ALKPHOS", "BILITOT",  "PROT", "ALBUMIN" in the last 8760 hours. No results for input(s): "WBC", "NEUTROABS", "HGB", "HCT", "MCV", "PLT" in the last 8760 hours. Lab Results  Component Value Date   TSH 0.830 01/09/2014   Lab Results  Component Value Date   HGBA1C 5.7 01/09/2014   No results found for: "CHOL", "HDL", "LDLCALC", "LDLDIRECT", "TRIG", "CHOLHDL"  Significant Diagnostic Results in last 30 days:  No results found.  Assessment/Plan Shoulder pain   Left shoulder pain for three weeks following an incident while lifting her mother. Pain is intermittent, exacerbated by certain movements, and less intense than initially but persistent. Differential includes muscle strain or other soft tissue injury. Ibuprofen  and Aleve have been ineffective.   - Order shoulder x-ray to assess for underlying injury.   - Prescribe Flexeril  up to three times daily as needed for muscle relaxation.   - Prescribe Diclofenac  for pain management, with caution regarding drowsiness.    Nicotine dependence   Long-standing nicotine dependence with previous successful cessation using Chantix, which is no longer available. Adverse reaction to Wellbutrin  previously, but willing to retry for smoking cessation. Currently smoking half a pack to a full pack per day, with a desire to quit.   - Prescribe Wellbutrin  starting at 150 mg once daily, with the option to increase to 300 mg daily if tolerated.   - Discussed smoking cessation process with Wellbutrin , including initial smoking while reducing gradually.    Goals of Care   Discussion regarding her mother, who has severe dementia and is bedridden. The family is considering placing her in a care facility due to the inability to continue home care. Hospice care was discussed as an option, given her declining condition and recent hospitalization for pneumonia.   - Coordinate with family for hospice evaluation and potential placement in a care facility.   - Discuss MOST form completion with  her sister, who is the power of attorney, during their visit on the 29th.   Family/ staff Communication: Reviewed plan of care with patient verbalized understanding   Labs/tests ordered: -  CBC with Differential/Platelet - CMP with eGFR([facility]) - TSH - Lipid panel - Hep C Antibody - HIV Antibody (routine testing w rflx)   Next Appointment: Return in about 6 months (around 07/19/2024) for Fasting labs in the morning or in one week .   Total time: 45 minutes. Greater than 50% of total time spent doing patient education regarding Establish care and health maintenance including symptom/medication management.   Estil Heman, NP

## 2024-01-20 ENCOUNTER — Ambulatory Visit
Admission: RE | Admit: 2024-01-20 | Discharge: 2024-01-20 | Disposition: A | Discharge status: Home / Self Care | Source: Ambulatory Visit | Attending: Family | Admitting: Family

## 2024-01-20 DIAGNOSIS — M25512 Pain in left shoulder: Secondary | ICD-10-CM

## 2024-01-27 ENCOUNTER — Encounter: Payer: Self-pay | Admitting: Nurse Practitioner

## 2024-01-28 ENCOUNTER — Other Ambulatory Visit

## 2024-01-28 DIAGNOSIS — Z7689 Persons encountering health services in other specified circumstances: Secondary | ICD-10-CM

## 2024-01-28 DIAGNOSIS — Z1159 Encounter for screening for other viral diseases: Secondary | ICD-10-CM

## 2024-01-28 DIAGNOSIS — Z113 Encounter for screening for infections with a predominantly sexual mode of transmission: Secondary | ICD-10-CM

## 2024-01-28 DIAGNOSIS — Z13220 Encounter for screening for lipoid disorders: Secondary | ICD-10-CM

## 2024-01-28 NOTE — Telephone Encounter (Signed)
 Message routed to [place], NP

## 2024-01-29 LAB — CBC WITH DIFFERENTIAL/PLATELET
Absolute Lymphocytes: 2470 {cells}/uL (ref 850–3900)
Absolute Monocytes: 359 {cells}/uL (ref 200–950)
Basophils Absolute: 48 {cells}/uL (ref 0–200)
Basophils Relative: 0.7 %
Eosinophils Absolute: 110 {cells}/uL (ref 15–500)
Eosinophils Relative: 1.6 %
HCT: 44.5 % (ref 35.0–45.0)
Hemoglobin: 14.7 g/dL (ref 11.7–15.5)
MCH: 31.9 pg (ref 27.0–33.0)
MCHC: 33 g/dL (ref 32.0–36.0)
MCV: 96.5 fL (ref 80.0–100.0)
MPV: 11.6 fL (ref 7.5–12.5)
Monocytes Relative: 5.2 %
Neutro Abs: 3912 {cells}/uL (ref 1500–7800)
Neutrophils Relative %: 56.7 %
Platelets: 272 10*3/uL (ref 140–400)
RBC: 4.61 10*6/uL (ref 3.80–5.10)
RDW: 12 % (ref 11.0–15.0)
Total Lymphocyte: 35.8 %
WBC: 6.9 10*3/uL (ref 3.8–10.8)

## 2024-01-29 LAB — LIPID PANEL
Cholesterol: 213 mg/dL — ABNORMAL HIGH (ref <200)
HDL: 61 mg/dL (ref >=50)
LDL Cholesterol (Calc): 135 mg/dL — ABNORMAL HIGH
Non-HDL Cholesterol (Calc): 152 mg/dL — ABNORMAL HIGH (ref <130)
Total CHOL/HDL Ratio: 3.5 (calc) (ref <5.0)
Triglycerides: 80 mg/dL (ref <150)

## 2024-01-29 LAB — COMPLETE METABOLIC PANEL WITHOUT GFR
AG Ratio: 1.7 (calc) (ref 1.0–2.5)
ALT: 10 U/L (ref 6–29)
AST: 18 U/L (ref 10–35)
Albumin: 4.3 g/dL (ref 3.6–5.1)
Alkaline phosphatase (APISO): 69 U/L (ref 37–153)
BUN: 23 mg/dL (ref 7–25)
CO2: 29 mmol/L (ref 20–32)
Calcium: 9.7 mg/dL (ref 8.6–10.4)
Chloride: 107 mmol/L (ref 98–110)
Creat: 0.9 mg/dL (ref 0.50–1.05)
Globulin: 2.5 g/dL (ref 1.9–3.7)
Glucose, Bld: 102 mg/dL — ABNORMAL HIGH (ref 65–99)
Potassium: 4.3 mmol/L (ref 3.5–5.3)
Sodium: 141 mmol/L (ref 135–146)
Total Bilirubin: 0.4 mg/dL (ref 0.2–1.2)
Total Protein: 6.8 g/dL (ref 6.1–8.1)

## 2024-01-29 LAB — TSH: TSH: 2.26 m[IU]/L (ref 0.40–4.50)

## 2024-01-29 LAB — HIV ANTIBODY (ROUTINE TESTING W REFLEX): HIV 1&2 Ab, 4th Generation: NONREACTIVE

## 2024-01-29 LAB — HEPATITIS C ANTIBODY: Hepatitis C Ab: NONREACTIVE

## 2024-01-31 ENCOUNTER — Other Ambulatory Visit: Payer: Self-pay | Admitting: Nurse Practitioner

## 2024-01-31 ENCOUNTER — Telehealth: Payer: Self-pay

## 2024-01-31 ENCOUNTER — Encounter: Payer: Self-pay | Admitting: Nurse Practitioner

## 2024-01-31 DIAGNOSIS — M25512 Pain in left shoulder: Secondary | ICD-10-CM

## 2024-01-31 NOTE — Telephone Encounter (Signed)
 Copied from CRM [id number]. Topic: General - Other >> Jan 28, 2024  9:45 AM Amanda Tanner wrote: Reason for CRM: Patient returning [facility] call she received yesterday , Amanda Tanner is out of the office today and will have Amanda Tanner to call patient back  Patient call back number [phone]  Spoke with patient over the phone

## 2024-01-31 NOTE — Telephone Encounter (Signed)
 Spoke with patient the over the phone this morning

## 2024-02-08 ENCOUNTER — Ambulatory Visit (INDEPENDENT_AMBULATORY_CARE_PROVIDER_SITE_OTHER): Admitting: Family Medicine

## 2024-02-08 ENCOUNTER — Encounter: Payer: Self-pay | Admitting: Family Medicine

## 2024-02-08 ENCOUNTER — Other Ambulatory Visit: Payer: Self-pay

## 2024-02-08 VITALS — BP 142/83 | Ht 66.0 in | Wt 130.0 lb

## 2024-02-08 DIAGNOSIS — M25512 Pain in left shoulder: Secondary | ICD-10-CM | POA: Diagnosis not present

## 2024-02-08 MED ORDER — NITROGLYCERIN 0.2 MG/HR TD PT24: MEDICATED_PATCH | TRANSDERMAL | 1 refills | Status: AC

## 2024-02-08 MED ORDER — MELOXICAM 15 MG PO TABS: 15.0000 mg | ORAL_TABLET | Freq: Every day | ORAL | 1 refills | Status: DC

## 2024-02-08 NOTE — Progress Notes (Cosign Needed Addendum)
 PCP: Ngetich, Elijio Guadeloupe, NP  Chief Complaint: Left shoulder pain, 2 months Subjective:   HPI: Patient is a 63 y.o. female here for acute left shoulder pain.  Patient states that 2 months ago she was lifting up her mother from the bed via a belt and was pulling her and felt pain in the left shoulder immediately.  Patient states that she has been having difficulty with her left shoulder since then.  Patient notes that she has difficulty with putting her arm behind her back or putting on her bra.  Patient notes that she saw her PCP who gave her some anti-inflammatory medications but states that that has been minimally helpful.  Patient also had x-rays done..    Past Medical History:  Diagnosis Date   GERD (gastroesophageal reflux disease)     Current Outpatient Medications on File Prior to Visit  Medication Sig Dispense Refill   buPROPion  (WELLBUTRIN  SR) 150 MG 12 hr tablet Take 1 tablet (150 mg total) by mouth daily. 30 tablet 3   cyclobenzaprine  (FLEXERIL ) 10 MG tablet Take 1 tablet (10 mg total) by mouth 3 (three) times daily as needed for muscle spasms. 20 tablet 0   esomeprazole (NEXIUM) 20 MG capsule Take 20 mg by mouth as needed.     Multiple Vitamin (MULTIVITAMIN) tablet Take 1 tablet by mouth daily.     naproxen sodium (ALEVE) 220 MG tablet Take 220 mg by mouth as needed.     No current facility-administered medications on file prior to visit.    No past surgical history on file.  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  Minimal AC joint narrowing.  No signs of any acute fractures.     Objective:  Physical Exam:  Gen: NAD, comfortable in exam room  Inspection reveals no gross abnormality  of the left shoulder.  Mild tenderness to palpation over the humeral head.  Noted decreased range of motion with internal rotation and liftoff.  Pain against resistance with internal rotation.  Minimal pain with abduction against resistance.  Empty can negative, Neer's impingement negative.  Liftoff test positive.  MSK U/S LT SHOULDER Date: 02/09/24 Indication: Lt shoulder pain Findings: - Left bicipital groove appears intact without any swelling.  Left bicep tendon appears intact.   - Left subscapularis shows some hypoechoic change consistent with partial tearing as well as some neo-vessels on Doppler.   - Left infraspinatus appears intact without any tearing.   - Left supraspinatus appears intact without any tearing, notable hypoechoic change in the supraspinatus and some fluid in the subacromial bursa consistent with bursitis.  Impression:  - Mild hypoechoic changes of the supraspinatus consistent with tendinopathy - Subacromial bursitis - Hypoechoic changes of the subscapularis as well as neovessels consistent with partial tear. Images and interpretation completed by Jude Norton, MD and reviewed by Marvel Slicker, DO    Assessment & Plan:  1. Acute pain of left shoulder (Primary) - Patient presenting for acute left shoulder pain.  Ultrasound findings and physical exam are consistent with possible subscapularis tear.  At this  time we will go ahead and do meloxicam 15 mg daily.  Patient states that she is not currently taking diclofenac .  Will also go ahead and do nitroglycerin patches and rotator cuff strengthening exercises - Patient to follow-up in 3-4 weeks for reevaluation, if no improvement can consider steroid injection. - US  COMPLETE JOINT SPACE STRUCTURE UP LEFT; Future - meloxicam (MOBIC) 15 MG tablet; Take 1 tablet (15 mg total) by mouth daily.  Dispense: 14 tablet; Refill: 1 - nitroGLYCERIN (NITRODUR - DOSED IN MG/24 HR) 0.2 mg/hr patch; Use 1/4 patch daily to the affected area.   Dispense: 30 patch; Refill: 1     Jude Norton MD, PGY-4  Sports Medicine Fellow [facility]

## 2024-02-08 NOTE — Patient Instructions (Signed)

## 2024-02-29 ENCOUNTER — Ambulatory Visit (INDEPENDENT_AMBULATORY_CARE_PROVIDER_SITE_OTHER): Admitting: Family Medicine

## 2024-02-29 ENCOUNTER — Encounter: Payer: Self-pay | Admitting: Family Medicine

## 2024-02-29 VITALS — BP 122/84 | Ht 66.0 in | Wt 130.0 lb

## 2024-02-29 DIAGNOSIS — M25512 Pain in left shoulder: Secondary | ICD-10-CM | POA: Diagnosis not present

## 2024-02-29 NOTE — Progress Notes (Signed)
 PCP: Ngetich, Elijio Guadeloupe, NP  Chief Complaint: Shoulder pain left Subjective:   HPI: Patient is a 63 y.o. female here for follow-up of her left shoulder pain.  Patient notes that she has had about 50% improvement and is doing better.  Patient still has some pain with internal rotation.  Patient does note that she feels little bit better and is continuing doing meloxicam  and the nitro patches as well as her exercise.    Past Medical History:  Diagnosis Date   GERD (gastroesophageal reflux disease)     Current Outpatient Medications on File Prior to Visit  Medication Sig Dispense Refill   buPROPion  (WELLBUTRIN  SR) 150 MG 12 hr tablet Take 1 tablet (150 mg total) by mouth daily. 30 tablet 3   cyclobenzaprine  (FLEXERIL ) 10 MG tablet Take 1 tablet (10 mg total) by mouth 3 (three) times daily as needed for muscle spasms. 20 tablet 0   esomeprazole (NEXIUM) 20 MG capsule Take 20 mg by mouth as needed.     meloxicam  (MOBIC ) 15 MG tablet Take 1 tablet (15 mg total) by mouth daily. 14 tablet 1   Multiple Vitamin (MULTIVITAMIN) tablet Take 1 tablet by mouth daily.     naproxen sodium (ALEVE) 220 MG tablet Take 220 mg by mouth as needed.     nitroGLYCERIN  (NITRODUR - DOSED IN MG/24 HR) 0.2 mg/hr patch Use 1/4 patch daily to the affected area. 30 patch 1   No current facility-administered medications on file prior to visit.    No past surgical history on file.  No Known Allergies  BP 122/84   Ht 5\' 6"  (1.676 m)   Wt 130 lb (59 kg)   BMI 20.98 kg/m       No data to display              No data to display              Objective:  Physical Exam:  Gen: NAD, comfortable in exam room  Inspection reveals no gross abnormalities.  Increased range of motion with abduction, external rotation as well as internal rotation though still limited.  Pain against internal rotation.  Pain against active abduction.  Hawkins negative, empty can positive.   Assessment & Plan:  1. 1. Acute pain  of left shoulder (Primary) - Patient with improvement of her pain.  Patient still with some limited internal rotation noticed expected given subscapularis findings.  Will go ahead and continue with rotator cuff strengthening, meloxicam  as needed as well as nitro patches.  Did advise patient to follow-up again in 6 weeks, at that time we will reevaluate, if minimal improvement will consider getting an MRI.  No need for steroid injection at this time as patient's pain is improving.   Marillyn Goren MD, PGY-4  Sports Medicine Fellow [facility]

## 2024-04-11 ENCOUNTER — Other Ambulatory Visit: Payer: Self-pay | Admitting: Physician Assistant

## 2024-04-11 ENCOUNTER — Ambulatory Visit: Admitting: Family Medicine

## 2024-04-11 DIAGNOSIS — R1011 Right upper quadrant pain: Secondary | ICD-10-CM

## 2024-04-13 ENCOUNTER — Ambulatory Visit
Admission: RE | Admit: 2024-04-13 | Discharge: 2024-04-13 | Disposition: A | Discharge status: Home / Self Care | Source: Ambulatory Visit | Attending: Physician Assistant | Admitting: Physician Assistant

## 2024-04-13 DIAGNOSIS — R1011 Right upper quadrant pain: Secondary | ICD-10-CM

## 2024-04-14 ENCOUNTER — Encounter (HOSPITAL_COMMUNITY): Payer: Self-pay

## 2024-04-14 ENCOUNTER — Observation Stay (HOSPITAL_COMMUNITY): Admission: EM | Admit: 2024-04-14 | Discharge: 2024-04-18 | Disposition: A | Discharge status: Home / Self Care

## 2024-04-14 ENCOUNTER — Other Ambulatory Visit: Payer: Self-pay | Admitting: Physician Assistant

## 2024-04-14 ENCOUNTER — Other Ambulatory Visit: Payer: Self-pay

## 2024-04-14 DIAGNOSIS — R1011 Right upper quadrant pain: Secondary | ICD-10-CM

## 2024-04-14 DIAGNOSIS — R932 Abnormal findings on diagnostic imaging of liver and biliary tract: Secondary | ICD-10-CM | POA: Diagnosis not present

## 2024-04-14 DIAGNOSIS — K8012 Calculus of gallbladder with acute and chronic cholecystitis without obstruction: Secondary | ICD-10-CM | POA: Diagnosis not present

## 2024-04-14 DIAGNOSIS — Z87891 Personal history of nicotine dependence: Secondary | ICD-10-CM | POA: Insufficient documentation

## 2024-04-14 DIAGNOSIS — R109 Unspecified abdominal pain: Secondary | ICD-10-CM | POA: Diagnosis present

## 2024-04-14 DIAGNOSIS — K810 Acute cholecystitis: Secondary | ICD-10-CM | POA: Diagnosis present

## 2024-04-14 DIAGNOSIS — K819 Cholecystitis, unspecified: Principal | ICD-10-CM

## 2024-04-14 LAB — COMPREHENSIVE METABOLIC PANEL WITH GFR
ALT: 14 U/L (ref 0–44)
AST: 15 U/L (ref 15–41)
Albumin: 3.6 g/dL (ref 3.5–5.0)
Alkaline Phosphatase: 69 U/L (ref 38–126)
Anion gap: 10 (ref 5–15)
BUN: 19 mg/dL (ref 8–23)
CO2: 27 mmol/L (ref 22–32)
Calcium: 8.5 mg/dL — ABNORMAL LOW (ref 8.9–10.3)
Chloride: 103 mmol/L (ref 98–111)
Creatinine, Ser: 0.77 mg/dL (ref 0.44–1.00)
GFR, Estimated: >60 mL/min (ref >60)
Glucose, Bld: 107 mg/dL — ABNORMAL HIGH (ref 70–99)
Potassium: 3.3 mmol/L — ABNORMAL LOW (ref 3.5–5.1)
Sodium: 140 mmol/L (ref 135–145)
Total Bilirubin: 0.5 mg/dL (ref 0.0–1.2)
Total Protein: 6.6 g/dL (ref 6.5–8.1)

## 2024-04-14 LAB — CBC
HCT: 40 % (ref 36.0–46.0)
Hemoglobin: 12.8 g/dL (ref 12.0–15.0)
MCH: 31.4 pg (ref 26.0–34.0)
MCHC: 32 g/dL (ref 30.0–36.0)
MCV: 98 fL (ref 80.0–100.0)
Platelets: 264 K/uL (ref 150–400)
RBC: 4.08 MIL/uL (ref 3.87–5.11)
RDW: 12.5 % (ref 11.5–15.5)
WBC: 7.1 K/uL (ref 4.0–10.5)
nRBC: 0 % (ref 0.0–0.2)

## 2024-04-14 LAB — LIPASE, BLOOD: Lipase: 39 U/L (ref 11–51)

## 2024-04-14 MED ORDER — ONDANSETRON HCL 4 MG/2ML IJ SOLN
4.0000 mg | Freq: Four times a day (QID) | INTRAMUSCULAR | Status: DC | PRN
  Administered 2024-04-14 – 2024-04-15 (×3): 4 mg via INTRAVENOUS
  Filled 2024-04-14 (×3): qty 2

## 2024-04-14 MED ORDER — DIPHENHYDRAMINE HCL 50 MG/ML IJ SOLN: 25.0000 mg | Freq: Four times a day (QID) | INTRAMUSCULAR | Status: DC | PRN

## 2024-04-14 MED ORDER — SODIUM CHLORIDE 0.9 % IV SOLN: INTRAVENOUS | Status: AC

## 2024-04-14 MED ORDER — ONDANSETRON HCL 4 MG/2ML IJ SOLN: 4.0000 mg | Freq: Once | INTRAMUSCULAR | Status: DC | PRN

## 2024-04-14 MED ORDER — SIMETHICONE 80 MG PO CHEW: 40.0000 mg | CHEWABLE_TABLET | Freq: Four times a day (QID) | ORAL | Status: DC | PRN

## 2024-04-14 MED ORDER — CEFAZOLIN SODIUM-DEXTROSE 2-4 GM/100ML-% IV SOLN: 2.0000 g | INTRAVENOUS | Status: AC

## 2024-04-14 MED ORDER — NICOTINE 7 MG/24HR TD PT24
7.0000 mg | MEDICATED_PATCH | Freq: Every day | TRANSDERMAL | Status: DC
  Administered 2024-04-14 – 2024-04-18 (×5): 7 mg via TRANSDERMAL
  Filled 2024-04-14 (×5): qty 1

## 2024-04-14 MED ORDER — ACETAMINOPHEN 500 MG PO TABS
1000.0000 mg | ORAL_TABLET | Freq: Four times a day (QID) | ORAL | Status: DC
  Administered 2024-04-14 – 2024-04-18 (×12): 1000 mg via ORAL
  Filled 2024-04-14 (×15): qty 2

## 2024-04-14 MED ORDER — ENOXAPARIN SODIUM 40 MG/0.4ML IJ SOSY
40.0000 mg | PREFILLED_SYRINGE | INTRAMUSCULAR | Status: DC
  Administered 2024-04-14 – 2024-04-17 (×4): 40 mg via SUBCUTANEOUS
  Filled 2024-04-14 (×4): qty 0.4

## 2024-04-14 MED ORDER — HYDROMORPHONE HCL 1 MG/ML IJ SOLN
1.0000 mg | INTRAMUSCULAR | Status: DC | PRN
  Administered 2024-04-14 – 2024-04-15 (×3): 1 mg via INTRAVENOUS
  Filled 2024-04-14 (×3): qty 1

## 2024-04-14 MED ORDER — ACETAMINOPHEN 650 MG RE SUPP
650.0000 mg | Freq: Four times a day (QID) | RECTAL | Status: DC | PRN
Start: 2024-04-14 — End: 2024-04-18

## 2024-04-14 MED ORDER — DIPHENHYDRAMINE HCL 25 MG PO CAPS: 25.0000 mg | ORAL_CAPSULE | Freq: Four times a day (QID) | ORAL | Status: DC | PRN

## 2024-04-14 MED ORDER — OXYCODONE HCL 5 MG PO TABS
5.0000 mg | ORAL_TABLET | ORAL | Status: DC | PRN
  Administered 2024-04-14: 10 mg via ORAL
  Administered 2024-04-16 (×2): 5 mg via ORAL
  Administered 2024-04-16 (×3): 10 mg via ORAL
  Administered 2024-04-17 (×2): 5 mg via ORAL
  Administered 2024-04-17 – 2024-04-18 (×4): 10 mg via ORAL
  Filled 2024-04-14: qty 2
  Filled 2024-04-14 (×2): qty 1
  Filled 2024-04-14 (×8): qty 2

## 2024-04-14 MED ORDER — ACETAMINOPHEN 325 MG PO TABS: 650.0000 mg | ORAL_TABLET | Freq: Four times a day (QID) | ORAL | Status: DC | PRN

## 2024-04-14 MED ORDER — CEFAZOLIN SODIUM-DEXTROSE 2-4 GM/100ML-% IV SOLN
2.0000 g | Freq: Once | INTRAVENOUS | Status: DC
  Filled 2024-04-14: qty 100

## 2024-04-14 NOTE — H&P (Signed)
 Dagoberto Boys 1960-10-16  [id number].    Requesting MD: Andrea Ness Chief Complaint/Reason for Consult: Abdominal pain/Acute cholecystitis   HPI: Amanda Tanner is a 63 y.o. female with past medical history of GERD who presents to [facility] ED for severe abdominal pain that started on Saturday after having a greasy steak. Patient stated that shortly after the meal, she started having diarrhea with some nausea/vomiting. She states that the pain has been constant in the upper abdomen since onset though N/V/D have since improved. Patient elaborated that since the pain remained persistent, she made a visit to an urgent care on Tuesday where an ultrasound recommendation was made. She did the US  on Wednesday and was told to visit the ED for suspected gallbladder illness and hence she visited [facility] ED for further evaluation today.   She denies prior abdominal surgery, the use of blood thinners and alcohol use. Patient endorses smoking a pack of cigarettes every 3 days and admits to smoking marijuana.   ROS: All other systems negative except for HPI.  Family History  Problem Relation Age of Onset   Stroke Mother    Cancer Mother     Past Medical History:  Diagnosis Date   GERD (gastroesophageal reflux disease)     History reviewed. No pertinent surgical history.  Social History:  reports that she quit smoking about 9 years ago. Her smoking use included cigarettes. She does not have any smokeless tobacco history on file. She reports that she does not drink alcohol and does not use drugs.  Allergies: No Known Allergies  (Not in a hospital admission)    Physical Exam: Blood pressure 136/69, pulse 73, temperature 98.4 F (36.9 C), temperature source Oral, resp. rate 18, height 5' 6 (1.676 m), weight 59 kg, SpO2 98%.  Constitutional:      General: She is not in acute distress.    Appearance: She is not toxic-appearing.  Cardiovascular:     Rate and Rhythm: Normal rate.  Pulmonary:     Effort:  Pulmonary effort is normal.  Abdominal:     General: Abdomen is flat.     Palpations: Abdomen is soft.     Tenderness: There is abdominal tenderness in the right upper quadrant, right lower quadrant and epigastric area. Positive signs include Murphy's sign. Negative signs include Rovsing's sign and McBurney's sign.  Skin:    General: Skin is warm and dry.  Neurological:     General: No focal deficit present.     Mental Status: She is alert and oriented to person, place, and time   Results for orders placed or performed during the hospital encounter of 04/14/24 (from the past 48 hours)  Lipase, blood     Status: None   Collection Time: 04/14/24  2:09 PM  Result Value Ref Range   Lipase 39 11 - 51 U/L    Comment: Performed at [facility], [address] [place], [place] [address]  Comprehensive metabolic panel     Status: Abnormal   Collection Time: 04/14/24  2:09 PM  Result Value Ref Range   Sodium 140 135 - 145 mmol/L   Potassium 3.3 (L) 3.5 - 5.1 mmol/L   Chloride 103 98 - 111 mmol/L   CO2 27 22 - 32 mmol/L   Glucose, Bld 107 (H) 70 - 99 mg/dL    Comment: Glucose reference range applies only to samples taken after fasting for at least 8 hours.   BUN 19 8 - 23 mg/dL  Creatinine, Ser 0.77 0.44 - 1.00 mg/dL   Calcium 8.5 (L) 8.9 - 10.3 mg/dL   Total Protein 6.6 6.5 - 8.1 g/dL   Albumin 3.6 3.5 - 5.0 g/dL   AST 15 15 - 41 U/L   ALT 14 0 - 44 U/L   Alkaline Phosphatase 69 38 - 126 U/L   Total Bilirubin 0.5 0.0 - 1.2 mg/dL   GFR, Estimated >39 >39 mL/min    Comment: (NOTE) Calculated using the CKD-EPI Creatinine Equation (2021)    Anion gap 10 5 - 15    Comment: Performed at [facility], [address], [place], [place] [address]  CBC     Status: None   Collection Time: 04/14/24  2:09 PM  Result Value Ref Range   WBC 7.1 4.0 - 10.5 K/uL   RBC 4.08 3.87 - 5.11 MIL/uL   Hemoglobin 12.8 12.0 - 15.0 g/dL   HCT 59.9 63.9 - 53.9 %   MCV  98.0 80.0 - 100.0 fL   MCH 31.4 26.0 - 34.0 pg   MCHC 32.0 30.0 - 36.0 g/dL   RDW 87.4 88.4 - 84.4 %   Platelets 264 150 - 400 K/uL   nRBC 0.0 0.0 - 0.2 %    Comment: Performed at [facility], [address], [place], [place] [address]   US  Abdomen Limited RUQ (LIVER/GB) Result Date: 04/13/2024 CLINICAL DATA:  HX OF RIGHT UPPER BILIARY COLIC LIKE EPISODE. POSITIVE MURPHY SIGN ON EXAM. EXAM: ULTRASOUND ABDOMEN LIMITED RIGHT UPPER QUADRANT COMPARISON:  None Available. FINDINGS: Gallbladder: 2.4 cm stone in the gallbladder neck. Small echogenicity along the gallbladder fundus measuring 3 mm. Gallbladder wall is thickened measuring 5 mm. No pericholecystic fluid. Positive sonographic Murphy's sign noted by sonographer. Common bile duct: Diameter: 10 mm Liver: Normal echogenicity. No focal lesion identified. Mild central intrahepatic biliary ductal dilation. Portal vein is patent on color Doppler imaging with normal direction of blood flow towards the liver. Other: None. IMPRESSION: 1. In the gallbladder neck, there is a 2.4 cm gallstone. Wall thickening and positive sonographic Beverley sign are worrisome for acute cholecystitis. 2. Mild central intrahepatic ductal dilation with dilation of the common bile duct, measuring 10 mm. Correlation with serum bilirubin recommended. A multiphase MRI of the abdomen with IV contrast recommended for further characterization. 3. Small echogenicity along the gallbladder fundus measuring 3 mm, which may represent a gallbladder polyp, adherent biliary sludge, or possibly a small locule of gas in the gallbladder wall in the setting of emphysematous cholecystitis. Electronically Signed   By: Rogelia Myers M.D.   On: 04/13/2024 10:36    Anti-infectives (From admission, onward)    None       Assessment/Plan Acute Calculous Cholecystitis   -US  shows gallbladder neck, there is a 2.4 cm gallstone. Wall thickening and positive sonographic Beverley sign  are worrisome for acute cholecystitis. Mild central intrahepatic ductal dilation with dilation of the common bile duct, measuring 10 mm. dherent biliary sludge, or possibly a small locule of gas in the gallbladder wall in the setting of emphysematous cholecystitis.  -LFTs, WBC and lipase all WNL. - Based on patient's history, P/E, labs and imaging, patient is displaying symptoms consistent with calculous cholecystitis. Patient tender in RUQ on exam.  - Patient will need to have a laparoscopic cholecystectomy with possible IOC for definitive resolution of symptoms. Will discuss timing of operation with my attending.    - Pain management - IVF - Monitor CBC, Lipase and LFTs  FEN - NPO  VTE - SCDs ID - Not indicated  Dispo - observation, med-surg   I reviewed nursing notes, ED provider notes, last 24 h vitals and pain scores, last 48 h intake and output, last 24 h labs and trends, and last 24 h imaging results   Eulah Hammonds, [facility] 04/14/2024, 3:35 PM Please see Amion for pager number during day hours 7:00am-4:30pm

## 2024-04-14 NOTE — ED Triage Notes (Signed)
 Pt reports went to urgent care the other day for abdominal pain, was sent for US  outpatient. Completed US  and advised to go to the ED for gallbladder. No other symptoms reported.

## 2024-04-14 NOTE — ED Provider Notes (Signed)
 [place] EMERGENCY DEPARTMENT AT [facility] Provider Note   CSN: [id number] Arrival date & time: 04/14/24  1222     Patient presents with: Abdominal Pain and Nausea   Amanda Tanner is a 63 y.o. female.   Patient is a 63 year old female who presents after she was found to have an abnormal gallbladder ultrasound.  She states she ate a fatty steak last weekend on Saturday.  She thought she had the stomach flu after that as she had vomiting and diarrhea as well as upper abdominal pain.  She said the vomiting resolved but she continued to have upper abdominal pain.  She went to her doctor and had an ultrasound performed.  The ultrasound was concerning for acute cholecystitis and she was sent here for further evaluation.  She says she still has some pain but it has eased off.  It comes and goes.  She denies any ongoing vomiting.  No fevers.  She is able to eat but has been maintaining more of a bland diet.       Prior to Admission medications   Medication Sig Start Date End Date Taking? Authorizing Provider  bismuth subsalicylate (PEPTO BISMOL) 262 MG chewable tablet Chew 4 tablets by mouth as needed for indigestion or diarrhea or loose stools.   Yes [provider]  esomeprazole (NEXIUM) 20 MG capsule Take 20 mg by mouth as needed.   Yes [provider]  naproxen sodium (ALEVE) 220 MG tablet Take 220 mg by mouth as needed.   Yes [provider]  nitroGLYCERIN  (NITRODUR - DOSED IN MG/24 HR) 0.2 mg/hr patch Use 1/4 patch daily to the affected area. 02/08/24  Yes Jha, Panav, MD  ondansetron (ZOFRAN-ODT) 8 MG disintegrating tablet Take 8 mg by mouth as needed for nausea or vomiting. 04/11/24  Yes [provider]  Multiple Vitamin (MULTIVITAMIN) tablet Take 1 tablet by mouth daily. Patient not taking: Reported on 04/14/2024    [provider]    Allergies: Patient has no known allergies.    Review of Systems  Constitutional:  Negative for  chills, diaphoresis, fatigue and fever.  HENT:  Negative for congestion, rhinorrhea and sneezing.   Eyes: Negative.   Respiratory:  Negative for cough, chest tightness and shortness of breath.   Cardiovascular:  Negative for chest pain and leg swelling.  Gastrointestinal:  Positive for abdominal pain, diarrhea, nausea and vomiting. Negative for blood in stool.  Genitourinary:  Negative for difficulty urinating, flank pain, frequency and hematuria.  Musculoskeletal:  Negative for arthralgias and back pain.  Skin:  Negative for rash.  Neurological:  Negative for dizziness, speech difficulty, weakness, numbness and headaches.    Updated Vital Signs BP (!) 141/81 (BP Location: Left Arm)   Pulse 66   Temp 98 F (36.7 C) (Oral)   Resp 16   Ht 5' 6 (1.676 m)   Wt 59 kg   LMP  (Approximate)   SpO2 98%   BMI 20.98 kg/m   Physical Exam Constitutional:      Appearance: She is well-developed.  HENT:     Head: Normocephalic and atraumatic.  Eyes:     Pupils: Pupils are equal, round, and reactive to light.  Cardiovascular:     Rate and Rhythm: Normal rate and regular rhythm.     Heart sounds: Normal heart sounds.  Pulmonary:     Effort: Pulmonary effort is normal. No respiratory distress.     Breath sounds: Normal breath sounds. No wheezing or rales.  Chest:     Chest wall: No tenderness.  Abdominal:     General: Bowel sounds are normal.     Palpations: Abdomen is soft.     Tenderness: There is abdominal tenderness in the right upper quadrant. There is no guarding or rebound.  Musculoskeletal:        General: Normal range of motion.     Cervical back: Normal range of motion and neck supple.  Lymphadenopathy:     Cervical: No cervical adenopathy.  Skin:    General: Skin is warm and dry.     Findings: No rash.  Neurological:     Mental Status: She is alert and oriented to person, place, and time.     (all labs ordered are listed, but only abnormal results are  displayed) Labs Reviewed  COMPREHENSIVE METABOLIC PANEL WITH GFR - Abnormal; Notable for the following components:      Result Value   Potassium 3.3 (*)    Glucose, Bld 107 (*)    Calcium 8.5 (*)    All other components within normal limits  LIPASE, BLOOD  CBC  CBC  COMPREHENSIVE METABOLIC PANEL WITH GFR    EKG: None  Radiology: US  Abdomen Limited RUQ (LIVER/GB) Result Date: 04/13/2024 CLINICAL DATA:  HX OF RIGHT UPPER BILIARY COLIC LIKE EPISODE. POSITIVE MURPHY SIGN ON EXAM. EXAM: ULTRASOUND ABDOMEN LIMITED RIGHT UPPER QUADRANT COMPARISON:  None Available. FINDINGS: Gallbladder: 2.4 cm stone in the gallbladder neck. Small echogenicity along the gallbladder fundus measuring 3 mm. Gallbladder wall is thickened measuring 5 mm. No pericholecystic fluid. Positive sonographic Murphy's sign noted by sonographer. Common bile duct: Diameter: 10 mm Liver: Normal echogenicity. No focal lesion identified. Mild central intrahepatic biliary ductal dilation. Portal vein is patent on color Doppler imaging with normal direction of blood flow towards the liver. Other: None. IMPRESSION: 1. In the gallbladder neck, there is a 2.4 cm gallstone. Wall thickening and positive sonographic Beverley sign are worrisome for acute cholecystitis. 2. Mild central intrahepatic ductal dilation with dilation of the common bile duct, measuring 10 mm. Correlation with serum bilirubin recommended. A multiphase MRI of the abdomen with IV contrast recommended for further characterization. 3. Small echogenicity along the gallbladder fundus measuring 3 mm, which may represent a gallbladder polyp, adherent biliary sludge, or possibly a small locule of gas in the gallbladder wall in the setting of emphysematous cholecystitis. Electronically Signed   By: Rogelia Myers M.D.   On: 04/13/2024 10:36     Procedures   Medications Ordered in the ED  enoxaparin (LOVENOX) injection 40 mg (40 mg Subcutaneous Given 04/14/24 2135)  0.9 %  sodium  chloride infusion ( Intravenous New Bag/Given 04/14/24 1809)  acetaminophen  (TYLENOL ) tablet 1,000 mg (1,000 mg Oral Given 04/14/24 1818)  acetaminophen  (TYLENOL ) tablet 650 mg (has no administration in time range)    Or  acetaminophen  (TYLENOL ) suppository 650 mg (has no administration in time range)  oxyCODONE (Oxy IR/ROXICODONE) immediate release tablet 5-10 mg (10 mg Oral Given 04/14/24 1819)  HYDROmorphone (DILAUDID) injection 1 mg (1 mg Intravenous Given 04/14/24 2135)  diphenhydrAMINE (BENADRYL) capsule 25 mg (has no administration in time range)    Or  diphenhydrAMINE (BENADRYL) injection 25 mg (has no administration in time range)  ondansetron (ZOFRAN) injection 4 mg (4 mg Intravenous Given 04/14/24 2207)  simethicone (MYLICON) chewable tablet 40 mg (has no administration in time range)  nicotine (NICODERM CQ - dosed in mg/24 hr) patch 7 mg (7 mg Transdermal Patch Applied 04/14/24 1815)  ceFAZolin (ANCEF) IVPB 2g/100 mL premix (has no administration in time range)                                    Medical Decision Making Amount and/or Complexity of Data Reviewed Labs: ordered.  Risk Prescription drug management. Decision regarding hospitalization.   This patient presents to the ED for concern of abdominal pain, this involves an extensive number of treatment options, and is a complaint that carries with it a high risk of complications and morbidity.  I considered the following differential and admission for this acute, potentially life threatening condition.  The differential diagnosis includes gallstones, cholecystitis, pancreatitis, appendicitis, kidney stone  MDM:    Patient presents with right upper quadrant abdominal pain.  She had an outpatient ultrasound that was concerning for acute cholecystitis.  I reviewed these results.  There is also a gallstone in the neck of the gallbladder.  Labs reviewed and are overall nonconcerning.  LFTs normal, lipase normal, WBC count normal.   Discussed with the PA with general surgery who will see the patient.  Patient has been admitted to general surgery for operative repair.  (Labs, imaging, consults)  Labs: I Ordered, and personally interpreted labs.  The pertinent results include: Normal white count, normal LFTs, no suggestions of pancreatitis  Imaging Studies ordered: I ordered imaging studies including outpatient gallbladder ultrasound reviewed I independently visualized and interpreted imaging. I agree with the radiologist interpretation  Additional history obtained from chart.  External records from outside source obtained and reviewed including notes  Cardiac Monitoring: The patient was not maintained on a cardiac monitor.  If on the cardiac monitor, I personally viewed and interpreted the cardiac monitored which showed an underlying rhythm of:    Reevaluation: After the interventions noted above, I reevaluated the patient and found that they have :stayed the same  Social Determinants of Health:    Disposition: Admit to hospital  Co morbidities that complicate the patient evaluation  Past Medical History:  Diagnosis Date   GERD (gastroesophageal reflux disease)      Medicines Meds ordered this encounter  Medications   DISCONTD: ondansetron (ZOFRAN) injection 4 mg   enoxaparin (LOVENOX) injection 40 mg   0.9 %  sodium chloride infusion   acetaminophen  (TYLENOL ) tablet 1,000 mg   OR Linked Order Group    acetaminophen  (TYLENOL ) tablet 650 mg    acetaminophen  (TYLENOL ) suppository 650 mg   oxyCODONE (Oxy IR/ROXICODONE) immediate release tablet 5-10 mg    Refill:  0   HYDROmorphone (DILAUDID) injection 1 mg   OR Linked Order Group    diphenhydrAMINE (BENADRYL) capsule 25 mg    diphenhydrAMINE (BENADRYL) injection 25 mg   ondansetron (ZOFRAN) injection 4 mg   simethicone (MYLICON) chewable tablet 40 mg   nicotine (NICODERM CQ - dosed in mg/24 hr) patch 7 mg   DISCONTD: ceFAZolin (ANCEF) IVPB 2g/100  mL premix    Antibiotic Indication::   Surgical Prophylaxis   ceFAZolin (ANCEF) IVPB 2g/100 mL premix    Antibiotic Indication::   Surgical Prophylaxis    I have reviewed the patients home medicines and have made adjustments as needed  Problem List / ED Course: Problem List Items Addressed This Visit   None Visit Diagnoses       Cholecystitis    -  Primary                Final diagnoses:  Cholecystitis    ED Discharge Orders     None          Lenor Hollering, MD 04/14/24 2231

## 2024-04-15 ENCOUNTER — Other Ambulatory Visit: Payer: Self-pay

## 2024-04-15 ENCOUNTER — Observation Stay (HOSPITAL_COMMUNITY)

## 2024-04-15 ENCOUNTER — Encounter (HOSPITAL_COMMUNITY)
Admission: EM | Disposition: A | Discharge status: Home / Self Care | Payer: Self-pay | Source: Home / Self Care | Attending: Emergency Medicine

## 2024-04-15 ENCOUNTER — Observation Stay (HOSPITAL_COMMUNITY): Admitting: Anesthesiology

## 2024-04-15 DIAGNOSIS — K819 Cholecystitis, unspecified: Secondary | ICD-10-CM

## 2024-04-15 HISTORY — PX: CHOLECYSTECTOMY: SHX55

## 2024-04-15 LAB — COMPREHENSIVE METABOLIC PANEL WITH GFR
ALT: 25 U/L (ref 0–44)
AST: 30 U/L (ref 15–41)
Albumin: 3.1 g/dL — ABNORMAL LOW (ref 3.5–5.0)
Alkaline Phosphatase: 80 U/L (ref 38–126)
Anion gap: 6 (ref 5–15)
BUN: 16 mg/dL (ref 8–23)
CO2: 27 mmol/L (ref 22–32)
Calcium: 8.6 mg/dL — ABNORMAL LOW (ref 8.9–10.3)
Chloride: 106 mmol/L (ref 98–111)
Creatinine, Ser: 0.7 mg/dL (ref 0.44–1.00)
GFR, Estimated: >60 mL/min (ref >60)
Glucose, Bld: 107 mg/dL — ABNORMAL HIGH (ref 70–99)
Potassium: 3.8 mmol/L (ref 3.5–5.1)
Sodium: 139 mmol/L (ref 135–145)
Total Bilirubin: 1 mg/dL (ref 0.0–1.2)
Total Protein: 5.9 g/dL — ABNORMAL LOW (ref 6.5–8.1)

## 2024-04-15 LAB — CBC
HCT: 41.8 % (ref 36.0–46.0)
Hemoglobin: 13.2 g/dL (ref 12.0–15.0)
MCH: 31.4 pg (ref 26.0–34.0)
MCHC: 31.6 g/dL (ref 30.0–36.0)
MCV: 99.5 fL (ref 80.0–100.0)
Platelets: 261 K/uL (ref 150–400)
RBC: 4.2 MIL/uL (ref 3.87–5.11)
RDW: 12.4 % (ref 11.5–15.5)
WBC: 7.9 K/uL (ref 4.0–10.5)
nRBC: 0 % (ref 0.0–0.2)

## 2024-04-15 LAB — SURGICAL PCR SCREEN
MRSA, PCR: NEGATIVE
Staphylococcus aureus: NEGATIVE

## 2024-04-15 SURGERY — LAPAROSCOPIC CHOLECYSTECTOMY WITH INTRAOPERATIVE CHOLANGIOGRAM
Anesthesia: General | Site: Abdomen

## 2024-04-15 MED ORDER — CEFAZOLIN SODIUM-DEXTROSE 2-4 GM/100ML-% IV SOLN
INTRAVENOUS | Status: AC
  Filled 2024-04-15: qty 100

## 2024-04-15 MED ORDER — OXYCODONE HCL 5 MG/5ML PO SOLN: 5.0000 mg | Freq: Once | ORAL | Status: AC | PRN

## 2024-04-15 MED ORDER — FENTANYL CITRATE PF 50 MCG/ML IJ SOSY: 25.0000 ug | PREFILLED_SYRINGE | INTRAMUSCULAR | Status: DC | PRN

## 2024-04-15 MED ORDER — ONDANSETRON HCL 4 MG/2ML IJ SOLN
INTRAMUSCULAR | Status: AC
  Filled 2024-04-15: qty 2

## 2024-04-15 MED ORDER — OXYCODONE HCL 5 MG PO TABS
5.0000 mg | ORAL_TABLET | Freq: Once | ORAL | Status: AC | PRN
  Administered 2024-04-15: 5 mg

## 2024-04-15 MED ORDER — FENTANYL CITRATE (PF) 100 MCG/2ML IJ SOLN
INTRAMUSCULAR | Status: DC | PRN
  Administered 2024-04-15 (×2): 50 ug via INTRAVENOUS

## 2024-04-15 MED ORDER — DEXAMETHASONE SODIUM PHOSPHATE 10 MG/ML IJ SOLN
INTRAMUSCULAR | Status: DC | PRN
Start: 2024-04-15 — End: 2024-04-15
  Administered 2024-04-15: 8 mg via INTRAVENOUS

## 2024-04-15 MED ORDER — LIDOCAINE HCL (PF) 2 % IJ SOLN
INTRAMUSCULAR | Status: AC
  Filled 2024-04-15: qty 5

## 2024-04-15 MED ORDER — LIDOCAINE HCL (PF) 2 % IJ SOLN
INTRAMUSCULAR | Status: DC | PRN
Start: 2024-04-15 — End: 2024-04-15
  Administered 2024-04-15: 100 mg via INTRADERMAL

## 2024-04-15 MED ORDER — PHENYLEPHRINE 80 MCG/ML (10ML) SYRINGE FOR IV PUSH (FOR BLOOD PRESSURE SUPPORT)
PREFILLED_SYRINGE | INTRAVENOUS | Status: AC
  Filled 2024-04-15: qty 10

## 2024-04-15 MED ORDER — PHENYLEPHRINE 80 MCG/ML (10ML) SYRINGE FOR IV PUSH (FOR BLOOD PRESSURE SUPPORT)
PREFILLED_SYRINGE | INTRAVENOUS | Status: DC | PRN
  Administered 2024-04-15: 40 ug via INTRAVENOUS

## 2024-04-15 MED ORDER — DEXAMETHASONE SODIUM PHOSPHATE 10 MG/ML IJ SOLN
INTRAMUSCULAR | Status: AC
  Filled 2024-04-15: qty 1

## 2024-04-15 MED ORDER — LACTATED RINGERS IR SOLN
Status: DC | PRN
  Administered 2024-04-15: 1000 mL

## 2024-04-15 MED ORDER — SUGAMMADEX SODIUM 200 MG/2ML IV SOLN
INTRAVENOUS | Status: DC | PRN
  Administered 2024-04-15: 200 mg via INTRAVENOUS

## 2024-04-15 MED ORDER — ROCURONIUM BROMIDE 10 MG/ML (PF) SYRINGE
PREFILLED_SYRINGE | INTRAVENOUS | Status: AC
  Filled 2024-04-15: qty 10

## 2024-04-15 MED ORDER — SODIUM CHLORIDE 0.9 % IV SOLN
INTRAVENOUS | Status: DC | PRN
  Administered 2024-04-15: 20 mL

## 2024-04-15 MED ORDER — ONDANSETRON HCL 4 MG/2ML IJ SOLN: 4.0000 mg | Freq: Four times a day (QID) | INTRAMUSCULAR | Status: DC | PRN

## 2024-04-15 MED ORDER — FENTANYL CITRATE (PF) 100 MCG/2ML IJ SOLN
INTRAMUSCULAR | Status: AC
  Filled 2024-04-15: qty 2

## 2024-04-15 MED ORDER — PROPOFOL 10 MG/ML IV BOLUS
INTRAVENOUS | Status: AC
  Filled 2024-04-15: qty 20

## 2024-04-15 MED ORDER — MIDAZOLAM HCL 2 MG/2ML IJ SOLN
INTRAMUSCULAR | Status: AC
  Filled 2024-04-15: qty 2

## 2024-04-15 MED ORDER — ESMOLOL HCL 100 MG/10ML IV SOLN
INTRAVENOUS | Status: AC
  Filled 2024-04-15: qty 10

## 2024-04-15 MED ORDER — ROCURONIUM BROMIDE 10 MG/ML (PF) SYRINGE
PREFILLED_SYRINGE | INTRAVENOUS | Status: DC | PRN
  Administered 2024-04-15: 50 mg via INTRAVENOUS

## 2024-04-15 MED ORDER — CEFAZOLIN SODIUM-DEXTROSE 2-3 GM-%(50ML) IV SOLR
INTRAVENOUS | Status: DC | PRN
Start: 2024-04-15 — End: 2024-04-15
  Administered 2024-04-15: 2 g via INTRAVENOUS

## 2024-04-15 MED ORDER — PANTOPRAZOLE SODIUM 40 MG PO TBEC: 40.0000 mg | DELAYED_RELEASE_TABLET | Freq: Every day | ORAL | Status: DC

## 2024-04-15 MED ORDER — OXYCODONE HCL 5 MG PO TABS
ORAL_TABLET | ORAL | Status: AC
Start: 2024-04-15 — End: 2024-04-15
  Filled 2024-04-15: qty 1

## 2024-04-15 MED ORDER — BUPIVACAINE-EPINEPHRINE 0.25% -1:200000 IJ SOLN
INTRAMUSCULAR | Status: DC | PRN
  Administered 2024-04-15: 30 mL

## 2024-04-15 MED ORDER — PANTOPRAZOLE SODIUM 40 MG PO TBEC
40.0000 mg | DELAYED_RELEASE_TABLET | Freq: Every day | ORAL | Status: DC
  Administered 2024-04-15 – 2024-04-18 (×4): 40 mg via ORAL
  Filled 2024-04-15 (×4): qty 1

## 2024-04-15 MED ORDER — LACTATED RINGERS IV SOLN: INTRAVENOUS | Status: DC | PRN

## 2024-04-15 MED ORDER — PROPOFOL 10 MG/ML IV BOLUS
INTRAVENOUS | Status: DC | PRN
  Administered 2024-04-15: 50 mg via INTRAVENOUS

## 2024-04-15 MED ORDER — HYDROMORPHONE HCL 2 MG/ML IJ SOLN
INTRAMUSCULAR | Status: AC
  Filled 2024-04-15: qty 1

## 2024-04-15 MED ORDER — ONDANSETRON HCL 4 MG/2ML IJ SOLN
INTRAMUSCULAR | Status: DC | PRN
  Administered 2024-04-15: 4 mg via INTRAVENOUS

## 2024-04-15 MED ORDER — BUPIVACAINE-EPINEPHRINE (PF) 0.25% -1:200000 IJ SOLN
INTRAMUSCULAR | Status: AC
  Filled 2024-04-15: qty 30

## 2024-04-15 SURGICAL SUPPLY — 33 items
BAG COUNTER SPONGE SURGICOUNT (BAG) IMPLANT
CABLE HIGH FREQUENCY MONO STRZ (ELECTRODE) ×1 IMPLANT
CATH URETL OPEN 5X70 (CATHETERS) IMPLANT
CHLORAPREP W/TINT 26 (MISCELLANEOUS) ×1 IMPLANT
CLIP APPLIE ROT 10 11.4 M/L (STAPLE) ×1 IMPLANT
COVER MAYO STAND XLG (MISCELLANEOUS) ×1 IMPLANT
COVER SURGICAL LIGHT HANDLE (MISCELLANEOUS) ×1 IMPLANT
DERMABOND ADVANCED .7 DNX12 (GAUZE/BANDAGES/DRESSINGS) ×1 IMPLANT
DRAPE C-ARM 42X120 X-RAY (DRAPES) IMPLANT
ELECT REM PT RETURN 15FT ADLT (MISCELLANEOUS) ×1 IMPLANT
ENDOLOOP SUT PDS II 0 18 (SUTURE) ×1 IMPLANT
GLOVE BIO SURGEON STRL SZ7.5 (GLOVE) ×1 IMPLANT
GLOVE INDICATOR 8.0 STRL GRN (GLOVE) ×1 IMPLANT
GOWN STRL REUS W/ TWL XL LVL3 (GOWN DISPOSABLE) ×1 IMPLANT
GRASPER SUT TROCAR 14GX15 (MISCELLANEOUS) IMPLANT
HEMOSTAT SNOW SURGICEL 2X4 (HEMOSTASIS) IMPLANT
IRRIGATION SUCT STRKRFLW 2 WTP (MISCELLANEOUS) ×1 IMPLANT
IV CATH 14GX2 1/4 (CATHETERS) ×1 IMPLANT
KIT BASIN OR (CUSTOM PROCEDURE TRAY) ×1 IMPLANT
KIT TURNOVER KIT A (KITS) ×1 IMPLANT
NDL INSUFFLATION 14GA 120MM (NEEDLE) ×1 IMPLANT
NEEDLE INSUFFLATION 14GA 120MM (NEEDLE) ×1 IMPLANT
POUCH RETRIEVAL ECOSAC 10 (ENDOMECHANICALS) ×1 IMPLANT
SCISSORS LAP 5X35 DISP (ENDOMECHANICALS) ×1 IMPLANT
SET TUBE SMOKE EVAC HIGH FLOW (TUBING) ×1 IMPLANT
SLEEVE Z-THREAD 5X100MM (TROCAR) ×2 IMPLANT
SPIKE FLUID TRANSFER (MISCELLANEOUS) ×1 IMPLANT
STOPCOCK 4 WAY LG BORE MALE ST (IV SETS) IMPLANT
SUT MNCRL AB 4-0 PS2 18 (SUTURE) ×1 IMPLANT
TOWEL OR 17X26 10 PK STRL BLUE (TOWEL DISPOSABLE) ×1 IMPLANT
TRAY LAPAROSCOPIC (CUSTOM PROCEDURE TRAY) ×1 IMPLANT
TROCAR ADV FIXATION 12X100MM (TROCAR) ×1 IMPLANT
TROCAR Z-THREAD OPTICAL 5X100M (TROCAR) ×1 IMPLANT

## 2024-04-15 NOTE — Consult Note (Signed)
 [facility] Consult  Referring Provider: No ref. provider found Primary Care Physician:  Ngetich, Roxan BROCKS, NP Primary Gastroenterologist: Sampson  Reason for Consultation: choledocholithiasis  SUBJECTIVE:   HPI: Amanda Tanner is a 64 y.o. female with past medical history significant for gastroesophageal reflux disease presented to hospital on 7/18/thousand 25 with chief complaint of 1 week of abdominal pain, nausea and vomiting.  She had attempted Pepto-Bismol which made her have black-colored stools.  Had some nausea and vomiting.  Labs on presentation showed total bilirubin 1.0, AST/ALT 30/25, alkaline phosphatase 80.  Abdominal ultrasound showed common bile duct 10 mm, 2.4 cm gallbladder neck stone.  Underwent laparoscopic cholecystectomy on 04/15/2024.  Intraoperative cholangiogram positive, there with filling defects sitting in the distal hepatic duct.  Patient seen and evaluated at bedside.  Recently postop, does appear somewhat lethargic.  Noted having some burning discomfort in her abdomen.  Otherwise doing well postoperatively.  Past Medical History:  Diagnosis Date   GERD (gastroesophageal reflux disease)    History reviewed. No pertinent surgical history. Prior to Admission medications   Medication Sig Start Date End Date Taking? Authorizing Provider  bismuth subsalicylate (PEPTO BISMOL) 262 MG chewable tablet Chew 4 tablets by mouth as needed for indigestion or diarrhea or loose stools.   Yes [provider]  esomeprazole (NEXIUM) 20 MG capsule Take 20 mg by mouth as needed.   Yes [provider]  naproxen sodium (ALEVE) 220 MG tablet Take 220 mg by mouth as needed.   Yes [provider]  nitroGLYCERIN  (NITRODUR - DOSED IN MG/24 HR) 0.2 mg/hr patch Use 1/4 patch daily to the affected area. 02/08/24  Yes Jha, Panav, MD  ondansetron  (ZOFRAN -ODT) 8 MG disintegrating tablet Take 8 mg by mouth as needed for nausea or vomiting. 04/11/24  Yes  [provider]  Multiple Vitamin (MULTIVITAMIN) tablet Take 1 tablet by mouth daily. Patient not taking: Reported on 04/14/2024    [provider]   Current Facility-Administered Medications  Medication Dose Route Frequency Provider Last Rate Last Admin   0.9 %  sodium chloride  infusion   Intravenous Continuous Stechschulte, Deward PARAS, MD 40 mL/hr at 04/14/24 1809 New Bag at 04/14/24 1809   acetaminophen  (TYLENOL ) tablet 650 mg  650 mg Oral Q6H PRN Stechschulte, Paul J, MD       Or   acetaminophen  (TYLENOL ) suppository 650 mg  650 mg Rectal Q6H PRN Stechschulte, Deward PARAS, MD       acetaminophen  (TYLENOL ) tablet 1,000 mg  1,000 mg Oral Q6H Stechschulte, Deward PARAS, MD   1,000 mg at 04/15/24 0550   diphenhydrAMINE  (BENADRYL ) capsule 25 mg  25 mg Oral Q6H PRN Stechschulte, Paul J, MD       Or   diphenhydrAMINE  (BENADRYL ) injection 25 mg  25 mg Intravenous Q6H PRN Stechschulte, Deward PARAS, MD       enoxaparin  (LOVENOX ) injection 40 mg  40 mg Subcutaneous Q24H Stechschulte, Paul J, MD   40 mg at 04/14/24 2135   HYDROmorphone  (DILAUDID ) injection 1 mg  1 mg Intravenous Q2H PRN Stechschulte, Paul J, MD   1 mg at 04/14/24 2135   nicotine  (NICODERM CQ  - dosed in mg/24 hr) patch 7 mg  7 mg Transdermal Daily Stechschulte, Paul J, MD   7 mg at 04/15/24 0950   ondansetron  (ZOFRAN ) injection 4 mg  4 mg Intravenous Q6H PRN Stechschulte, Paul J, MD   4 mg at 04/14/24 2207   oxyCODONE  (Oxy IR/ROXICODONE ) immediate release tablet 5-10 mg  5-10 mg Oral Q4H PRN Stechschulte, Deward PARAS, MD   10 mg at 04/14/24 1819   pantoprazole  (PROTONIX ) EC tablet 40 mg  40 mg Oral Daily Stechschulte, Deward PARAS, MD       simethicone  [facility]) chewable tablet 40 mg  40 mg Oral Q6H PRN Stechschulte, Deward PARAS, MD       Allergies as of 04/14/2024   (No Known Allergies)   Family History  Problem Relation Age of Onset   Stroke Mother    Cancer Mother    Social History   Socioeconomic History   Marital status: Divorced     Spouse name: Not on file   Number of children: Not on file   Years of education: Not on file   Highest education level: Not on file  Occupational History   Not on file  Tobacco Use   Smoking status: Former    Current packs/day: 0.00    Types: Cigarettes    Quit date: 04/28/2014    Years since quitting: 9.9   Smokeless tobacco: Not on file  Vaping Use   Vaping status: Never Used  Substance and Sexual Activity   Alcohol use: No   Drug use: No   Sexual activity: Not on file  Other Topics Concern   Not on file  Social History Narrative   Not on file   Social Drivers of Health   Financial Resource Strain: Not on file  Food Insecurity: No Food Insecurity (04/15/2024)   Hunger Vital Sign    Worried About Running Out of Food in the Last Year: Never true    Ran Out of Food in the Last Year: Never true  Transportation Needs: No Transportation Needs (04/15/2024)   PRAPARE - Administrator, Civil Service (Medical): No    Lack of Transportation (Non-Medical): No  Physical Activity: Not on file  Stress: Not on file  Social Connections: Socially Isolated (04/14/2024)   Social Connection and Isolation Panel    Frequency of Communication with Friends and Family: More than three times a week    Frequency of Social Gatherings with Friends and Family: More than three times a week    Attends Religious Services: Never    Database administrator or Organizations: No    Attends Banker Meetings: Never    Marital Status: Divorced  Catering manager Violence: Not At Risk (04/15/2024)   Humiliation, Afraid, Rape, and Kick questionnaire    Fear of Current or Ex-Partner: No    Emotionally Abused: No    Physically Abused: No    Sexually Abused: No   Review of Systems:  Review of Systems  Respiratory:  Negative for shortness of breath.   Cardiovascular:  Negative for chest pain.  Gastrointestinal:  Positive for abdominal pain, nausea and vomiting. Negative for constipation  and diarrhea.    OBJECTIVE:   Temp:  [97.8 F (36.6 C)-98.8 F (37.1 C)] 97.8 F (36.6 C) (07/19 1419) Pulse Rate:  [57-73] 73 (07/19 1419) Resp:  [10-18] 16 (07/19 1419) BP: (120-160)/(56-81) 155/65 (07/19 1419) SpO2:  [95 %-100 %] 100 % (07/19 1419) Weight:  [59.7 kg] 59.7 kg (07/19 0500) Last BM Date : 04/14/24 Physical Exam Constitutional:      General: She is not in acute distress.    Appearance: She is not ill-appearing, toxic-appearing or diaphoretic.  Cardiovascular:     Rate and Rhythm: Normal rate and regular rhythm.  Pulmonary:     Effort: No respiratory distress.  Breath sounds: Normal breath sounds.  Abdominal:     General: Bowel sounds are normal. There is no distension.     Palpations: Abdomen is soft.     Tenderness: There is no abdominal tenderness. There is no guarding.  Neurological:     Mental Status: She is alert.     Labs: Recent Labs    04/14/24 1409 04/15/24 0509  WBC 7.1 7.9  HGB 12.8 13.2  HCT 40.0 41.8  PLT 264 261   BMET Recent Labs    04/14/24 1409 04/15/24 0509  NA 140 139  K 3.3* 3.8  CL 103 106  CO2 27 27  GLUCOSE 107* 107*  BUN 19 16  CREATININE 0.77 0.70  CALCIUM 8.5* 8.6*   LFT Recent Labs    04/15/24 0509  PROT 5.9*  ALBUMIN 3.1*  AST 30  ALT 25  ALKPHOS 80  BILITOT 1.0   PT/INR No results for input(s): LABPROT, INR in the last 72 hours.  Diagnostic imaging: DG Cholangiogram Operative Result Date: 04/15/2024 CLINICAL DATA:  Intraoperative cholangiogram status post cholecystectomy. EXAM: INTRAOPERATIVE CHOLANGIOGRAM TECHNIQUE: Cholangiographic images from the C-arm fluoroscopic device were submitted for interpretation post-operatively. Please see the procedural report for the amount of contrast and the fluoroscopy time utilized. FLUOROSCOPY: Radiation Exposure Index (as provided by the fluoroscopic device): 1.6 mGy Kerma COMPARISON:  Right upper quadrant sonogram 04/13/2024 FINDINGS: T-Tube cholangiogram  was performed. Contrast injection opacifies the common bile duct. Signs intrahepatic bile duct dilatation noted. Multiple filling defects are identified within the proximal common bile duct for which underlying choledocholithiasis is suspected. No contrast extravasation identified to suggest bile leak. IMPRESSION: 1. Intraoperative cholangiogram demonstrates multiple filling defects within the proximal common bile duct for which underlying choledocholithiasis is suspected. 2. No contrast extravasation identified to suggest bile leak. Electronically Signed   By: Waddell Calk M.D.   On: 04/15/2024 13:08   IMPRESSION: Choledocholithiasis Cholecystitis status post laparoscopic cholecystectomy 04/15/2024 GERD  PLAN: -Recommend ERCP for bile duct stone management, will discuss timing with advanced endoscopy tomorrow (no definitive plan for ERCP tomorrow unless emergency) -Liver enzymes are currently unremarkable, no signs of ascending cholangitis  -Okay for diet per general surgery  -Trend liver enzymes -[facility] will follow   LOS: 0 days   Estefana Keas, [facility]

## 2024-04-15 NOTE — Anesthesia Procedure Notes (Signed)
 Procedure Name: Intubation Date/Time: 04/15/2024 11:35 AM  Performed by: Obadiah Reyes BROCKS, CRNAPre-anesthesia Checklist: Patient identified, Emergency Drugs available, Suction available and Patient being monitored Patient Re-evaluated:Patient Re-evaluated prior to induction Oxygen Delivery Method: Circle System Utilized Preoxygenation: Pre-oxygenation with 100% oxygen Induction Type: IV induction Ventilation: Mask ventilation without difficulty Laryngoscope Size: Miller and 2 Grade View: Grade II Tube type: Oral Tube size: 7.0 mm Number of attempts: 1 Airway Equipment and Method: Stylet and Oral airway Placement Confirmation: ETT inserted through vocal cords under direct vision, positive ETCO2 and breath sounds checked- equal and bilateral Secured at: 21 cm Tube secured with: Tape Dental Injury: Teeth and Oropharynx as per pre-operative assessment

## 2024-04-15 NOTE — TOC Initial Note (Signed)
 Transition of Care [facility]) - Initial/Assessment Note    Patient Details  Name: Amanda Tanner MRN: [id number] Date of Birth: 16-Jul-1961  [facility] CM/SW Contact:    Sonda Manuella Quill, RN Phone Number: 04/15/2024, 10:13 AM  Clinical Narrative:                 Spoke w/ pt and family in room; pt says she lives at home w/ her family; she plans to return at d/c; pt identified POC Norlene Harsh [phone]); family will provide transportation; she denied SDOH risks; pt says she does not have DME, HH services, or home oxygen; TOC will follow.  Expected Discharge Plan: Home/Self Care Barriers to Discharge: Continued Medical Work up   Patient Goals and CMS Choice Patient states their goals for this hospitalization and ongoing recovery are:: home          Expected Discharge Plan and Services   Discharge Planning Services: CM Consult   Living arrangements for the past 2 months: Single Family Home                                      Prior Living Arrangements/Services Living arrangements for the past 2 months: Single Family Home Lives with:: Relatives Patient language and need for interpreter reviewed:: Yes Do you feel safe going back to the place where you live?: Yes      Need for Family Participation in Patient Care: Yes (Comment) Care giver support system in place?: Yes (comment) Current home services:  (n/a) Criminal Activity/Legal Involvement Pertinent to Current Situation/Hospitalization: No - Comment as needed  Activities of Daily Living   ADL Screening (condition at time of admission) Independently performs ADLs?: Yes (appropriate for developmental age) Is the patient deaf or have difficulty hearing?: No Does the patient have difficulty seeing, even when wearing glasses/contacts?: No Does the patient have difficulty concentrating, remembering, or making decisions?: No  Permission Sought/Granted Permission sought to share information with :  Case Manager Permission granted to share information with : Yes, Release of Information Signed  Share Information with NAME: Case Manager     Permission granted to share info w Relationship: Hailey Palidwar (dtr) [phone]     Emotional Assessment Appearance:: Appears stated age Attitude/Demeanor/Rapport: Gracious Affect (typically observed): Accepting Orientation: : Oriented to Self, Oriented to Place, Oriented to  Time, Oriented to Situation Alcohol / Substance Use: Not Applicable Psych Involvement: No (comment)  Admission diagnosis:  Acute cholecystitis [K81.0] Cholecystitis [K81.9] Patient Active Problem List   Diagnosis Date Noted   Acute cholecystitis 04/14/2024   PCP:  Ngetich, Dinah C, NP Pharmacy:   [id number] - [place], [place] - [address] [place] [address] Phone: [phone] Fax: [phone]  [id number] - [place], [place] - [address] [place] [address] Phone: [phone] Fax: [phone]     Social Drivers of Health (SDOH) Social History: SDOH Screenings   Food Insecurity: No Food Insecurity (04/15/2024)  Housing: Low Risk  (04/15/2024)  Transportation Needs: No Transportation Needs (04/15/2024)  Utilities: Not At Risk (04/15/2024)  Depression (PHQ2-9): Low Risk  (01/18/2024)  Social Connections: Socially Isolated (04/14/2024)  Tobacco Use: Medium Risk (04/14/2024)   SDOH Interventions: Food Insecurity Interventions: Intervention Not Indicated, Inpatient TOC Housing Interventions: Intervention Not Indicated, Inpatient TOC Transportation Interventions: Intervention Not Indicated, Inpatient TOC Utilities Interventions: Intervention Not Indicated, Inpatient TOC  Readmission Risk Interventions     No data to display

## 2024-04-15 NOTE — Op Note (Signed)
 Patient: Amanda Tanner (04-26-1961, [id number])  Date of Surgery: 04/15/2024  Preoperative Diagnosis: Cholecystitis   Postoperative Diagnosis: Cholecystitis with choledocholithiasis on IOC  Surgical Procedure: LAPAROSCOPIC CHOLECYSTECTOMY WITH INTRAOPERATIVE CHOLANGIOGRAM: 52436 (CPT)    Operative Team Members:  Surgeons and Role:    * Salimatou Simone, Deward PARAS, MD - Primary   Anesthesiologist: Maryclare Cornet, MD CRNA: Obadiah Reyes BROCKS, CRNA   Anesthesia: General   Fluids:  Total I/O In: 1000 [I.V.:1000] Out: -   Complications: * No complications entered in OR log *  Drains:  none   Specimen:  ID Type Source Tests Collected by Time Destination  1 : gallbladder Tissue PATH Gallbladder SURGICAL PATHOLOGY Amanda Tanner, Deward PARAS, MD 04/15/2024 1200      Disposition:  PACU - hemodynamically stable.  Plan of Care: Continue inpatient care    Indications for Procedure: Amanda Tanner is a 63 y.o. female who presented with abdominal pain.  History, physical and imaging was concerning for cholecystitis.  Laparoscopic cholecystectomy was recommended for the patient.  The procedure itself, as well as the risks, benefits and alternatives were discussed with the patient.  Risks discussed included but were not limited to the risk of infection, bleeding, damage to nearby structures, need to convert to open procedure, incisional hernia, bile leak, common bile duct injury and the need for additional procedures or surgeries.  With this discussion complete and all questions answered the patient granted consent to proceed.  Findings: Inflamed gallbladder.  Large gallstones.  Positive IOC with stones in hepatic duct  Infection status: Patient: Amanda Tanner Emergency General Surgery Service Patient Case: Urgent Infection Present At Time Of Surgery (PATOS): Inflamed gallbladder   Description of Procedure:   On the date stated above, the patient was taken to the operating room suite and placed in supine  positioning.  Sequential compression devices were placed on the lower extremities to prevent blood clots.  General endotracheal anesthesia was induced. Preoperative antibiotics were given.  The patient's abdomen was prepped and draped in the usual sterile fashion.  A time-out was completed verifying the correct patient, procedure, positioning and equipment needed for the case.  We began by anesthetizing the skin with local anesthetic and then making a 5 mm incision just below the umbilicus.  We dissected through the subcutaneous tissues to the fascia.  The fascia was grasped and elevated using a Kocher clamp.  A Veress needle was inserted into the abdomen and the abdomen was insufflated to 15 mmHg.  A 5 mm trocar was inserted in this position under optical guidance and then the abdomen was inspected.  There was no trauma to the underlying viscera with initial trocar placement.  Any abnormal findings, other than inflammation in the right upper quadrant, are listed above in the findings section.  Three additional trocars were placed, one 12 mm trocar in the subxiphoid position, one 5 mm trocar in the midline epigastric area and one 5mm trocar in the right upper quadrant subcostally.  These were placed under direct vision without any trauma to the underlying viscera.    The patient was then placed in head up, left side down positioning.  The gallbladder was identified and dissected free from its attachments to the omentum allowing the duodenum to fall away.  The duodenum was densly adherent to the infundibulum.  The infundibulum of the gallbladder was dissected free working laterally to medially.  The cystic duct and cystic artery were dissected free from surrounding connective tissue.  The infundibulum of the gallbladder was  dissected off the cystic plate.  A critical view of safety was obtained with the cystic duct and cystic artery being cleared of connective tissues and clearly the only two structures entering  into the gallbladder with the liver clearly visible behind.  One clip was applied high on the cystic duct.  A small ductotomy was created below this using the endoscopic shears.  Mucousy hydrops fluid was drained from this ductotomy making me concerned that the obstruction was downstream from this ductotomy.  I palpated with the [place]  and attempts to milk any obstructing stone more distal in the cystic duct back up into the duct ottoman he.  I felt there was a large stone just below this.  I was unable to milk it back to this duct on me.  I extended the duct automate along the cystic duct and attempts to deliver the stone.  Eventually I was unable to palpate the stone well anymore.  I then performed a cholangiogram.  A cholangiogram catheter was introduced through the abdominal wall and into the cystic duct through this ductotomy.  The catheter was clipped into position.  The catheter was flushed to ensure no leakage around the clip.  We then removed the laparoscopic instruments and positioned the C-Arm to perform a cholangiogram.  The catheter was flushed with contrast under fluoroscopic visualization and a cholangiogram was obtained.  The cholangiogram visualized the biliary tree from the ampulla up to the first two biliary radicals in the liver.  There were filling defects sitting in the distal hepatic duct.  I wonder if I had milked the stone in the wrong direction.  The catheter clearly entered the cystic duct.  There was gradual tapering of the common bile duct down to the ampulla without evidence of stricture or other abnormalities at the ampulla.  Please see the EMR for saved representative images.  With our cholangiogram compete, we moved the c-arm away from the field and returned to laparoscopic surgery.  The patient will require ERCP.  Clips were then applied to the cystic duct and cystic artery and then these structures were divided.  A PDS endoloop was placed to secure the cystic duct.  The  gallbladder was dissected off the cystic plate, placed in an endocatch bag and removed from the 12 mm subxiphoid port site.  The clips were inspected and appeared effective.  The cystic plate was inspected and hemostasis was obtained using electrocautery.  A suction irrigator was used to clean the operative field.  Attention was turned to closure.  The 12 mm subxiphoid port site was closed using a 0-vicryl suture on a fascial suture passer.  The abdomen was desufflated.  The skin was closed using 4-0 monocryl and dermabond.  All sponge and needle counts were correct at the conclusion of the case.    Deward Foy, MD General, Bariatric, & Minimally Invasive Surgery [facility], [place]

## 2024-04-15 NOTE — Plan of Care (Signed)
   Problem: Education: Goal: Knowledge of General Education information will improve Description Including pain rating scale, medication(s)/side effects and non-pharmacologic comfort measures Outcome: Progressing   Problem: Health Behavior/Discharge Planning: Goal: Ability to manage health-related needs will improve Outcome: Progressing

## 2024-04-15 NOTE — H&P (View-Only) (Signed)
 [facility] Consult  Referring Provider: No ref. provider found Primary Care Physician:  Ngetich, Roxan BROCKS, NP Primary Gastroenterologist: Sampson  Reason for Consultation: choledocholithiasis  SUBJECTIVE:   HPI: Amanda Tanner is a 63 y.o. female with past medical history significant for gastroesophageal reflux disease presented to hospital on 7/18/thousand 25 with chief complaint of 1 week of abdominal pain, nausea and vomiting.  She had attempted Pepto-Bismol which made her have black-colored stools.  Had some nausea and vomiting.  Labs on presentation showed total bilirubin 1.0, AST/ALT 30/25, alkaline phosphatase 80.  Abdominal ultrasound showed common bile duct 10 mm, 2.4 cm gallbladder neck stone.  Underwent laparoscopic cholecystectomy on 04/15/2024.  Intraoperative cholangiogram positive, there with filling defects sitting in the distal hepatic duct.  Patient seen and evaluated at bedside.  Recently postop, does appear somewhat lethargic.  Noted having some burning discomfort in her abdomen.  Otherwise doing well postoperatively.  Past Medical History:  Diagnosis Date   GERD (gastroesophageal reflux disease)    History reviewed. No pertinent surgical history. Prior to Admission medications   Medication Sig Start Date End Date Taking? Authorizing Provider  bismuth subsalicylate (PEPTO BISMOL) 262 MG chewable tablet Chew 4 tablets by mouth as needed for indigestion or diarrhea or loose stools.   Yes [provider]  esomeprazole (NEXIUM) 20 MG capsule Take 20 mg by mouth as needed.   Yes [provider]  naproxen sodium (ALEVE) 220 MG tablet Take 220 mg by mouth as needed.   Yes [provider]  nitroGLYCERIN  (NITRODUR - DOSED IN MG/24 HR) 0.2 mg/hr patch Use 1/4 patch daily to the affected area. 02/08/24  Yes Jha, Panav, MD  ondansetron  (ZOFRAN -ODT) 8 MG disintegrating tablet Take 8 mg by mouth as needed for nausea or vomiting. 04/11/24  Yes  [provider]  Multiple Vitamin (MULTIVITAMIN) tablet Take 1 tablet by mouth daily. Patient not taking: Reported on 04/14/2024    [provider]   Current Facility-Administered Medications  Medication Dose Route Frequency Provider Last Rate Last Admin   0.9 %  sodium chloride  infusion   Intravenous Continuous Stechschulte, Deward PARAS, MD 40 mL/hr at 04/14/24 1809 New Bag at 04/14/24 1809   acetaminophen  (TYLENOL ) tablet 650 mg  650 mg Oral Q6H PRN Stechschulte, Paul J, MD       Or   acetaminophen  (TYLENOL ) suppository 650 mg  650 mg Rectal Q6H PRN Stechschulte, Deward PARAS, MD       acetaminophen  (TYLENOL ) tablet 1,000 mg  1,000 mg Oral Q6H Stechschulte, Deward PARAS, MD   1,000 mg at 04/15/24 0550   diphenhydrAMINE  (BENADRYL ) capsule 25 mg  25 mg Oral Q6H PRN Stechschulte, Paul J, MD       Or   diphenhydrAMINE  (BENADRYL ) injection 25 mg  25 mg Intravenous Q6H PRN Stechschulte, Deward PARAS, MD       enoxaparin  (LOVENOX ) injection 40 mg  40 mg Subcutaneous Q24H Stechschulte, Paul J, MD   40 mg at 04/14/24 2135   HYDROmorphone  (DILAUDID ) injection 1 mg  1 mg Intravenous Q2H PRN Stechschulte, Paul J, MD   1 mg at 04/14/24 2135   nicotine  (NICODERM CQ  - dosed in mg/24 hr) patch 7 mg  7 mg Transdermal Daily Stechschulte, Paul J, MD   7 mg at 04/15/24 0950   ondansetron  (ZOFRAN ) injection 4 mg  4 mg Intravenous Q6H PRN Stechschulte, Paul J, MD   4 mg at 04/14/24 2207   oxyCODONE  (Oxy IR/ROXICODONE ) immediate release tablet 5-10 mg  5-10 mg Oral Q4H PRN Stechschulte, Deward PARAS, MD   10 mg at 04/14/24 1819   pantoprazole  (PROTONIX ) EC tablet 40 mg  40 mg Oral Daily Stechschulte, Deward PARAS, MD       simethicone  [facility]) chewable tablet 40 mg  40 mg Oral Q6H PRN Stechschulte, Deward PARAS, MD       Allergies as of 04/14/2024   (No Known Allergies)   Family History  Problem Relation Age of Onset   Stroke Mother    Cancer Mother    Social History   Socioeconomic History   Marital status: Divorced     Spouse name: Not on file   Number of children: Not on file   Years of education: Not on file   Highest education level: Not on file  Occupational History   Not on file  Tobacco Use   Smoking status: Former    Current packs/day: 0.00    Types: Cigarettes    Quit date: 04/28/2014    Years since quitting: 9.9   Smokeless tobacco: Not on file  Vaping Use   Vaping status: Never Used  Substance and Sexual Activity   Alcohol use: No   Drug use: No   Sexual activity: Not on file  Other Topics Concern   Not on file  Social History Narrative   Not on file   Social Drivers of Health   Financial Resource Strain: Not on file  Food Insecurity: No Food Insecurity (04/15/2024)   Hunger Vital Sign    Worried About Running Out of Food in the Last Year: Never true    Ran Out of Food in the Last Year: Never true  Transportation Needs: No Transportation Needs (04/15/2024)   PRAPARE - Administrator, Civil Service (Medical): No    Lack of Transportation (Non-Medical): No  Physical Activity: Not on file  Stress: Not on file  Social Connections: Socially Isolated (04/14/2024)   Social Connection and Isolation Panel    Frequency of Communication with Friends and Family: More than three times a week    Frequency of Social Gatherings with Friends and Family: More than three times a week    Attends Religious Services: Never    Database administrator or Organizations: No    Attends Banker Meetings: Never    Marital Status: Divorced  Catering manager Violence: Not At Risk (04/15/2024)   Humiliation, Afraid, Rape, and Kick questionnaire    Fear of Current or Ex-Partner: No    Emotionally Abused: No    Physically Abused: No    Sexually Abused: No   Review of Systems:  Review of Systems  Respiratory:  Negative for shortness of breath.   Cardiovascular:  Negative for chest pain.  Gastrointestinal:  Positive for abdominal pain, nausea and vomiting. Negative for constipation  and diarrhea.    OBJECTIVE:   Temp:  [97.8 F (36.6 C)-98.8 F (37.1 C)] 97.8 F (36.6 C) (07/19 1419) Pulse Rate:  [57-73] 73 (07/19 1419) Resp:  [10-18] 16 (07/19 1419) BP: (120-160)/(56-81) 155/65 (07/19 1419) SpO2:  [95 %-100 %] 100 % (07/19 1419) Weight:  [59.7 kg] 59.7 kg (07/19 0500) Last BM Date : 04/14/24 Physical Exam Constitutional:      General: She is not in acute distress.    Appearance: She is not ill-appearing, toxic-appearing or diaphoretic.  Cardiovascular:     Rate and Rhythm: Normal rate and regular rhythm.  Pulmonary:     Effort: No respiratory distress.  Breath sounds: Normal breath sounds.  Abdominal:     General: Bowel sounds are normal. There is no distension.     Palpations: Abdomen is soft.     Tenderness: There is no abdominal tenderness. There is no guarding.  Neurological:     Mental Status: She is alert.     Labs: Recent Labs    04/14/24 1409 04/15/24 0509  WBC 7.1 7.9  HGB 12.8 13.2  HCT 40.0 41.8  PLT 264 261   BMET Recent Labs    04/14/24 1409 04/15/24 0509  NA 140 139  K 3.3* 3.8  CL 103 106  CO2 27 27  GLUCOSE 107* 107*  BUN 19 16  CREATININE 0.77 0.70  CALCIUM 8.5* 8.6*   LFT Recent Labs    04/15/24 0509  PROT 5.9*  ALBUMIN 3.1*  AST 30  ALT 25  ALKPHOS 80  BILITOT 1.0   PT/INR No results for input(s): LABPROT, INR in the last 72 hours.  Diagnostic imaging: DG Cholangiogram Operative Result Date: 04/15/2024 CLINICAL DATA:  Intraoperative cholangiogram status post cholecystectomy. EXAM: INTRAOPERATIVE CHOLANGIOGRAM TECHNIQUE: Cholangiographic images from the C-arm fluoroscopic device were submitted for interpretation post-operatively. Please see the procedural report for the amount of contrast and the fluoroscopy time utilized. FLUOROSCOPY: Radiation Exposure Index (as provided by the fluoroscopic device): 1.6 mGy Kerma COMPARISON:  Right upper quadrant sonogram 04/13/2024 FINDINGS: T-Tube cholangiogram  was performed. Contrast injection opacifies the common bile duct. Signs intrahepatic bile duct dilatation noted. Multiple filling defects are identified within the proximal common bile duct for which underlying choledocholithiasis is suspected. No contrast extravasation identified to suggest bile leak. IMPRESSION: 1. Intraoperative cholangiogram demonstrates multiple filling defects within the proximal common bile duct for which underlying choledocholithiasis is suspected. 2. No contrast extravasation identified to suggest bile leak. Electronically Signed   By: Waddell Calk M.D.   On: 04/15/2024 13:08   IMPRESSION: Choledocholithiasis Cholecystitis status post laparoscopic cholecystectomy 04/15/2024 GERD  PLAN: -Recommend ERCP for bile duct stone management, will discuss timing with advanced endoscopy tomorrow (no definitive plan for ERCP tomorrow unless emergency) -Liver enzymes are currently unremarkable, no signs of ascending cholangitis  -Okay for diet per general surgery  -Trend liver enzymes -[facility] will follow   LOS: 0 days   Estefana Keas, [facility]

## 2024-04-15 NOTE — Anesthesia Preprocedure Evaluation (Signed)
 Anesthesia Evaluation  Patient identified by MRN, date of birth, ID band Patient awake    Reviewed: Allergy & Precautions, H&P , NPO status , Patient's Chart, lab work & pertinent test results  Airway Mallampati: II   Neck ROM: full    Dental   Pulmonary former smoker   breath sounds clear to auscultation       Cardiovascular negative cardio ROS  Rhythm:regular Rate:Normal     Neuro/Psych    GI/Hepatic ,GERD  ,,cholecystitis   Endo/Other    Renal/GU      Musculoskeletal   Abdominal   Peds  Hematology   Anesthesia Other Findings   Reproductive/Obstetrics                              Anesthesia Physical Anesthesia Plan  ASA: 2  Anesthesia Plan: General   Post-op Pain Management:    Induction: Intravenous  PONV Risk Score and Plan: 3 and Ondansetron , Dexamethasone , Midazolam  and Treatment may vary due to age or medical condition  Airway Management Planned: Oral ETT  Additional Equipment:   Intra-op Plan:   Post-operative Plan: Extubation in OR  Informed Consent: I have reviewed the patients History and Physical, chart, labs and discussed the procedure including the risks, benefits and alternatives for the proposed anesthesia with the patient or authorized representative who has indicated his/her understanding and acceptance.     Dental advisory given  Plan Discussed with: CRNA, Anesthesiologist and Surgeon  Anesthesia Plan Comments:         Anesthesia Quick Evaluation

## 2024-04-15 NOTE — Transfer of Care (Signed)
 Immediate Anesthesia Transfer of Care Note  Patient: Amanda Tanner  Procedure(s) Performed: LAPAROSCOPIC CHOLECYSTECTOMY WITH INTRAOPERATIVE CHOLANGIOGRAM (Abdomen)  Patient Location: PACU  Anesthesia Type:General  Level of Consciousness: sedated and responds to stimulation  Airway & Oxygen Therapy: Patient Spontanous Breathing and Patient connected to face mask oxygen  Post-op Assessment: Report given to RN and Post -op Vital signs reviewed and stable  Post vital signs: Reviewed and stable  Last Vitals:  Vitals Value Taken Time  BP 122/56 04/15/24 13:04  Temp    Pulse 65 04/15/24 13:05  Resp 13 04/15/24 13:05  SpO2 100 % 04/15/24 13:05  Vitals shown include unfiled device data.  Last Pain:  Vitals:   04/15/24 0920  TempSrc: Oral  PainSc:          Complications: No notable events documented.

## 2024-04-15 NOTE — Progress Notes (Signed)
 Progress Note: General Surgery Service   Chief Complaint/Subjective: Pain doing a little better this AM  Objective: Vital signs in last 24 hours: Temp:  [98 F (36.7 C)-98.8 F (37.1 C)] 98 F (36.7 C) (07/19 0544) Pulse Rate:  [57-84] 57 (07/19 0544) Resp:  [15-18] 15 (07/19 0544) BP: (122-159)/(57-81) 122/57 (07/19 0544) SpO2:  [95 %-100 %] 99 % (07/19 0544) Weight:  [59 kg-59.7 kg] 59.7 kg (07/19 0500) Last BM Date : 04/14/24  Intake/Output from previous day: 07/18 0701 - 07/19 0700 In: 642.4 [P.O.:200; I.V.:442.4] Out: 0  Intake/Output this shift: No intake/output data recorded.  Constitutional: NAD; conversant; no deformities Eyes: Moist conjunctiva; no lid lag; anicteric; PERRL Neck: Trachea midline; no thyromegaly Lungs: Normal respiratory effort; no tactile fremitus CV: RRR; no palpable thrills; no pitting edema GI: Abd Soft, some tenderness RUQ; no palpable hepatosplenomegaly MSK: Normal range of motion of extremities; no clubbing/cyanosis Psychiatric: Appropriate affect; alert and oriented x3 Lymphatic: No palpable cervical or axillary lymphadenopathy  Lab Results: CBC  Recent Labs    04/14/24 1409 04/15/24 0509  WBC 7.1 7.9  HGB 12.8 13.2  HCT 40.0 41.8  PLT 264 261   BMET Recent Labs    04/14/24 1409 04/15/24 0509  NA 140 139  K 3.3* 3.8  CL 103 106  CO2 27 27  GLUCOSE 107* 107*  BUN 19 16  CREATININE 0.77 0.70  CALCIUM 8.5* 8.6*   PT/INR No results for input(s): LABPROT, INR in the last 72 hours. ABG No results for input(s): PHART, HCO3 in the last 72 hours.  Invalid input(s): PCO2, PO2  Anti-infectives: Anti-infectives (From admission, onward)    Start     Dose/Rate Route Frequency Ordered Stop   04/14/24 1800  ceFAZolin  (ANCEF ) IVPB 2g/100 mL premix        2 g 200 mL/hr over 30 Minutes Intravenous On call to O.R. 04/14/24 1658 04/15/24 0559   04/14/24 1700  ceFAZolin  (ANCEF ) IVPB 2g/100 mL premix  Status:   Discontinued        2 g 200 mL/hr over 30 Minutes Intravenous  Once 04/14/24 1649 04/14/24 1658       Medications: Scheduled Meds:  acetaminophen   1,000 mg Oral Q6H   enoxaparin  (LOVENOX ) injection  40 mg Subcutaneous Q24H   nicotine   7 mg Transdermal Daily   Continuous Infusions:  sodium chloride  40 mL/hr at 04/14/24 1809   PRN Meds:.acetaminophen  **OR** acetaminophen , diphenhydrAMINE  **OR** diphenhydrAMINE , HYDROmorphone  (DILAUDID ) injection, ondansetron  (ZOFRAN ) IV, oxyCODONE , simethicone   Assessment/Plan: Amanda Tanner is a 63 year old female with abdominal pain and findings concerning for acute cholecystitis on imaging.  Her common bile duct is 10mm on ultrasound.  I recommended laparoscopic cholecystectomy with intraoperative cholangiogram.  We discussed the procedure, its risks, benefits and alternatives and the patient granted consent to proceed.   LOS: 0 days    Amanda JINNY Foy, MD  [facility] Use [email or web address] to contact on call provider  Daily Billing: [phone] - High MDM

## 2024-04-15 NOTE — Plan of Care (Signed)
  Problem: Clinical Measurements: Goal: Ability to maintain clinical measurements within normal limits will improve Outcome: Progressing   Problem: Activity: Goal: Risk for activity intolerance will decrease Outcome: Progressing   Problem: Coping: Goal: Level of anxiety will decrease Outcome: Progressing   Problem: Pain Managment: Goal: General experience of comfort will improve and/or be controlled Outcome: Progressing   Problem: Safety: Goal: Ability to remain free from injury will improve Outcome: Progressing

## 2024-04-16 ENCOUNTER — Observation Stay (HOSPITAL_COMMUNITY): Admitting: Anesthesiology

## 2024-04-16 ENCOUNTER — Encounter (HOSPITAL_COMMUNITY): Payer: Self-pay

## 2024-04-16 ENCOUNTER — Observation Stay (HOSPITAL_COMMUNITY)

## 2024-04-16 ENCOUNTER — Encounter (HOSPITAL_COMMUNITY)
Admission: EM | Disposition: A | Discharge status: Home / Self Care | Payer: Self-pay | Source: Home / Self Care | Attending: Emergency Medicine

## 2024-04-16 DIAGNOSIS — R932 Abnormal findings on diagnostic imaging of liver and biliary tract: Secondary | ICD-10-CM

## 2024-04-16 HISTORY — PX: ERCP: SHX5425

## 2024-04-16 HISTORY — PX: SPHINCTEROTOMY: SHX5279

## 2024-04-16 HISTORY — PX: STONE EXTRACTION WITH BASKET: SHX5318

## 2024-04-16 LAB — CBC
HCT: 41.9 % (ref 36.0–46.0)
Hemoglobin: 13.6 g/dL (ref 12.0–15.0)
MCH: 32.2 pg (ref 26.0–34.0)
MCHC: 32.5 g/dL (ref 30.0–36.0)
MCV: 99.1 fL (ref 80.0–100.0)
Platelets: 261 K/uL (ref 150–400)
RBC: 4.23 MIL/uL (ref 3.87–5.11)
RDW: 12.1 % (ref 11.5–15.5)
WBC: 12 K/uL — ABNORMAL HIGH (ref 4.0–10.5)
nRBC: 0 % (ref 0.0–0.2)

## 2024-04-16 LAB — HEPATIC FUNCTION PANEL
ALT: 28 U/L (ref 0–44)
AST: 29 U/L (ref 15–41)
Albumin: 3.2 g/dL — ABNORMAL LOW (ref 3.5–5.0)
Alkaline Phosphatase: 77 U/L (ref 38–126)
Bilirubin, Direct: <0.1 mg/dL (ref 0.0–0.2)
Total Bilirubin: 0.6 mg/dL (ref 0.0–1.2)
Total Protein: 6.1 g/dL — ABNORMAL LOW (ref 6.5–8.1)

## 2024-04-16 LAB — BASIC METABOLIC PANEL WITH GFR
Anion gap: 10 (ref 5–15)
BUN: 14 mg/dL (ref 8–23)
CO2: 25 mmol/L (ref 22–32)
Calcium: 8.9 mg/dL (ref 8.9–10.3)
Chloride: 104 mmol/L (ref 98–111)
Creatinine, Ser: 0.67 mg/dL (ref 0.44–1.00)
GFR, Estimated: >60 mL/min (ref >60)
Glucose, Bld: 97 mg/dL (ref 70–99)
Potassium: 3.9 mmol/L (ref 3.5–5.1)
Sodium: 139 mmol/L (ref 135–145)

## 2024-04-16 SURGERY — ERCP, WITH INTERVENTION IF INDICATED
Anesthesia: General

## 2024-04-16 MED ORDER — LACTATED RINGERS IV SOLN
INTRAVENOUS | Status: DC | PRN
Start: 2024-04-16 — End: 2024-04-16

## 2024-04-16 MED ORDER — LIDOCAINE 2% (20 MG/ML) 5 ML SYRINGE
INTRAMUSCULAR | Status: DC | PRN
  Administered 2024-04-16: 60 mg via INTRAVENOUS

## 2024-04-16 MED ORDER — GLUCAGON HCL RDNA (DIAGNOSTIC) 1 MG IJ SOLR
INTRAMUSCULAR | Status: AC
  Filled 2024-04-16: qty 1

## 2024-04-16 MED ORDER — GLUCAGON HCL RDNA (DIAGNOSTIC) 1 MG IJ SOLR
INTRAMUSCULAR | Status: DC | PRN
  Administered 2024-04-16: .5 mg via INTRAVENOUS

## 2024-04-16 MED ORDER — CIPROFLOXACIN IN D5W 400 MG/200ML IV SOLN
INTRAVENOUS | Status: AC
  Filled 2024-04-16: qty 200

## 2024-04-16 MED ORDER — DEXAMETHASONE SODIUM PHOSPHATE 10 MG/ML IJ SOLN
INTRAMUSCULAR | Status: DC | PRN
  Administered 2024-04-16: 10 mg via INTRAVENOUS

## 2024-04-16 MED ORDER — SODIUM CHLORIDE 0.9 % IV SOLN
INTRAVENOUS | Status: DC | PRN
  Administered 2024-04-16: 50 mL

## 2024-04-16 MED ORDER — DICLOFENAC SUPPOSITORY 100 MG
RECTAL | Status: AC
  Filled 2024-04-16: qty 1

## 2024-04-16 MED ORDER — CIPROFLOXACIN IN D5W 400 MG/200ML IV SOLN
INTRAVENOUS | Status: DC | PRN
  Administered 2024-04-16: 400 mg via INTRAVENOUS

## 2024-04-16 MED ORDER — MIDAZOLAM HCL 5 MG/5ML IJ SOLN
INTRAMUSCULAR | Status: DC | PRN
  Administered 2024-04-16: 2 mg via INTRAVENOUS

## 2024-04-16 MED ORDER — PROPOFOL 10 MG/ML IV BOLUS
INTRAVENOUS | Status: DC | PRN
  Administered 2024-04-16: 130 mg via INTRAVENOUS

## 2024-04-16 MED ORDER — PHENYLEPHRINE HCL (PRESSORS) 10 MG/ML IV SOLN: INTRAVENOUS | Status: DC | PRN

## 2024-04-16 MED ORDER — ROCURONIUM BROMIDE 100 MG/10ML IV SOLN
INTRAVENOUS | Status: DC | PRN
  Administered 2024-04-16: 40 mg via INTRAVENOUS

## 2024-04-16 MED ORDER — FENTANYL CITRATE (PF) 100 MCG/2ML IJ SOLN
INTRAMUSCULAR | Status: DC | PRN
  Administered 2024-04-16 (×2): 50 ug via INTRAVENOUS

## 2024-04-16 MED ORDER — SUGAMMADEX SODIUM 200 MG/2ML IV SOLN
INTRAVENOUS | Status: DC | PRN
Start: 2024-04-16 — End: 2024-04-16
  Administered 2024-04-16: 200 mg via INTRAVENOUS

## 2024-04-16 MED ORDER — FENTANYL CITRATE (PF) 100 MCG/2ML IJ SOLN
INTRAMUSCULAR | Status: AC
Start: 2024-04-16 — End: 2024-04-16
  Filled 2024-04-16: qty 2

## 2024-04-16 MED ORDER — MIDAZOLAM HCL 2 MG/2ML IJ SOLN
INTRAMUSCULAR | Status: AC
  Filled 2024-04-16: qty 2

## 2024-04-16 MED ORDER — DICLOFENAC SUPPOSITORY 100 MG
RECTAL | Status: DC | PRN
  Administered 2024-04-16: 100 mg via RECTAL

## 2024-04-16 MED ORDER — PHENYLEPHRINE 80 MCG/ML (10ML) SYRINGE FOR IV PUSH (FOR BLOOD PRESSURE SUPPORT)
PREFILLED_SYRINGE | INTRAVENOUS | Status: DC | PRN
Start: 2024-04-16 — End: 2024-04-16
  Administered 2024-04-16: 240 ug via INTRAVENOUS
  Administered 2024-04-16: 160 ug via INTRAVENOUS
  Administered 2024-04-16: 240 ug via INTRAVENOUS

## 2024-04-16 MED ORDER — ONDANSETRON HCL 4 MG/2ML IJ SOLN
INTRAMUSCULAR | Status: DC | PRN
  Administered 2024-04-16: 4 mg via INTRAVENOUS

## 2024-04-16 MED ORDER — LIDOCAINE HCL (CARDIAC) PF 100 MG/5ML IV SOSY: PREFILLED_SYRINGE | INTRAVENOUS | Status: DC | PRN

## 2024-04-16 MED ORDER — EPINEPHRINE 1 MG/10ML IJ SOSY
PREFILLED_SYRINGE | INTRAMUSCULAR | Status: AC
  Filled 2024-04-16: qty 10

## 2024-04-16 NOTE — Transfer of Care (Signed)
 Immediate Anesthesia Transfer of Care Note  Patient: Amanda Tanner  Procedure(s) Performed: ERCP, WITH INTERVENTION IF INDICATED  Patient Location: PACU  Anesthesia Type:General  Level of Consciousness: drowsy and patient cooperative  Airway & Oxygen Therapy: Patient Spontanous Breathing and Patient connected to face mask oxygen  Post-op Assessment: Report given to RN and Post -op Vital signs reviewed and stable  Post vital signs: Reviewed and stable  Last Vitals:  Vitals Value Taken Time  BP 144/52 04/16/24 12:21  Temp    Pulse 87 04/16/24 12:22  Resp 20 04/16/24 12:22  SpO2 100 % 04/16/24 12:22  Vitals shown include unfiled device data.  Last Pain:  Vitals:   04/16/24 0944  TempSrc: Oral  PainSc:       Patients Stated Pain Goal: 0 (dicussed realistic pain managment post-op) (04/15/24 1341)  Complications: No notable events documented.

## 2024-04-16 NOTE — Plan of Care (Signed)
 Pt tolerated all scheduled and prn medications well. She had some pain that was well controlled with prn medications. Was able to get to the bathroom independently and void on two occasions with RN present. Pt npo at midnight. ERCP in the morning. Will continue to monitor.

## 2024-04-16 NOTE — Anesthesia Postprocedure Evaluation (Signed)
 Anesthesia Post Note  Patient: Amanda Tanner  Procedure(s) Performed: ERCP, WITH INTERVENTION IF INDICATED ERCP, WITH LITHROTRIPSY OR REMOVAL OF COMMON BILE DUCT CALCULUS USING BALLOON SPHINCTEROTOMY, GI     Patient location during evaluation: PACU Anesthesia Type: General Level of consciousness: awake and alert Pain management: pain level controlled Vital Signs Assessment: post-procedure vital signs reviewed and stable Respiratory status: spontaneous breathing, nonlabored ventilation, respiratory function stable and patient connected to nasal cannula oxygen Cardiovascular status: blood pressure returned to baseline and stable Postop Assessment: no apparent nausea or vomiting Anesthetic complications: no   No notable events documented.  Last Vitals:  Vitals:   04/16/24 1240 04/16/24 1301  BP: (!) 122/52 127/63  Pulse: 86 65  Resp: 16 16  Temp:  36.6 C  SpO2: 97% 98%    Last Pain:  Vitals:   04/16/24 1301  TempSrc: Oral  PainSc:                  [name]

## 2024-04-16 NOTE — Op Note (Addendum)
 [facility] Patient Name: Amanda Tanner Procedure Date: 04/16/2024 MRN: [id number] Attending MD: Lupita FORBES Commander , MD, [id number] Date of Birth: 03-11-1961 CSN: [id number] Age: 63 Admit Type: Inpatient Procedure:                ERCP Indications:              Filling defect on intraoperative cholangiogram Providers:                Lupita CHARLENA Commander, MD, Collene Edu, RN, Farris Southgate,                            Technician Referring MD:              Medicines:                General Anesthesia, Cipro  400 mg IV, diclofenac  100                            mg per rectum, glucagon  0.5 mg IV x 1 Complications:            No immediate complications. Estimated Blood Loss:     Estimated blood loss: none. Procedure:                Pre-Anesthesia Assessment:                           - Prior to the procedure, a History and Physical                            was performed, and patient medications and                            allergies were reviewed. The patient's tolerance of                            previous anesthesia was also reviewed. The risks                            and benefits of the procedure and the sedation                            options and risks were discussed with the patient.                            All questions were answered, and informed consent                            was obtained. Prior Anticoagulants: The patient                            last took Lovenox  (enoxaparin ) 1 day prior to the                            procedure (>12 hours ago). ASA Grade Assessment: II                            -  A patient with mild systemic disease. After                            reviewing the risks and benefits, the patient was                            deemed in satisfactory condition to undergo the                            procedure.                           After obtaining informed consent, the scope was                            passed under direct vision.  Throughout the                            procedure, the patient's blood pressure, pulse, and                            oxygen saturations were monitored continuously. The                            [employer] D single use                            duodenoscope was introduced through the mouth, and                            used to inject contrast into and used to inject                            contrast into the bile duct. The ERCP was                            accomplished without difficulty. The patient                            tolerated the procedure well. Scope In: Scope Out: Findings:      A scout film of the abdomen was obtained. Surgical clips, consistent       with a previous cholecystectomy, were seen in the area of the right       upper quadrant of the abdomen. The esophagus was successfully intubated       under direct vision. The scope was advanced to a normal major papilla in       the descending duodenum without detailed examination of the pharynx,       larynx and associated structures, and upper GI tract. The upper GI tract       was grossly normal. Esophagus not seen well due to side-viewing nature       of the scope. The bile duct was deeply cannulated, Rx 44 Hydratome       sphincterotome and Hydra Jagwire 035 used.. Contrast was injected. I  personally interpreted the bile duct images. There was appropriate flow       of contrast through the ducts. Opacification of the entire biliary tree       was successful. The maximum diameter of the ducts was 8 mm. I did not       identify any stones as suggested by the intraoperative cholangiogram at       first. Given the abnormal cholangiogram and suspicion for stones, a       biliary sphincterotomy was performed. There was no bleeding. The       sphincterotome was exchanged for a 9-12 mm stone extraction balloon       which was inserted and it was inflated to 12 mm at the bifurcation and        the intrahepatic system was injected. There were some vague possible       illing defects initially in the right main intrahepatic area but with       continued injection these dissipated. There were no discrete stone seen.       The bile duct was swept looking for objects, and some sludge was       removed. No discrete stones. Final occlusion cholangiogram/sleep       negative. Pancreas not entered. Impression:               - Choledocholithiasis was not seen as suggested by                            the intraoperative cholangiogram. Some sludge was                            removed so perhaps that was responsible for the                            filling defects seen on intraoperative                            cholangiogram versus gas/air.                           -Status post biliary sphincterotomy Moderate Sedation:      Not Applicable - Patient had care per Anesthesia. Recommendation:           - Clear liquid diet today may advance tomorrow if                            doing well. Should be able to be discharged                            tomorrow if doing well.                           [facility] will follow-up tomorrow                           CMET, CBC in a.m.                           I have discontinued Lovenox  for now since status  post biliary sphincterotomy, SCD's ordered Procedure Code(s):        --- Professional ---                           [phone], Endoscopic retrograde                            cholangiopancreatography (ERCP); with removal of                            calculi/debris from biliary/pancreatic duct(s)                           43262, Endoscopic retrograde                            cholangiopancreatography (ERCP); with                            sphincterotomy/papillotomy                           [phone], Endoscopic catheterization of the biliary                            ductal system, radiological supervision and                             interpretation Diagnosis Code(s):        --- Professional ---                           R93.2, Abnormal findings on diagnostic imaging of                            liver and biliary tract CPT copyright 2022 [employer]. All rights reserved. The codes documented in this report are preliminary and upon coder review may  be revised to meet current compliance requirements. Lupita FORBES Commander, MD 04/16/2024 12:29:00 PM This report has been signed electronically. Number of Addenda: 0

## 2024-04-16 NOTE — Anesthesia Procedure Notes (Signed)
 Procedure Name: Intubation Date/Time: 04/16/2024 11:19 AM  Performed by: Claudene Hoy CROME, CRNAPre-anesthesia Checklist: Patient identified, Emergency Drugs available, Suction available and Patient being monitored Patient Re-evaluated:Patient Re-evaluated prior to induction Oxygen Delivery Method: Circle System Utilized Preoxygenation: Pre-oxygenation with 100% oxygen Induction Type: IV induction Ventilation: Mask ventilation without difficulty Laryngoscope Size: Mac and 3 Grade View: Grade II Tube type: Oral Tube size: 7.0 mm Number of attempts: 1 Airway Equipment and Method: Stylet Placement Confirmation: ETT inserted through vocal cords under direct vision, positive ETCO2 and breath sounds checked- equal and bilateral Secured at: 20 cm Tube secured with: Tape Dental Injury: Teeth and Oropharynx as per pre-operative assessment

## 2024-04-16 NOTE — Plan of Care (Signed)
  Problem: Education: Goal: Knowledge of General Education information will improve Description: Including pain rating scale, medication(s)/side effects and non-pharmacologic comfort measures Outcome: Progressing   Problem: Health Behavior/Discharge Planning: Goal: Ability to manage health-related needs will improve Outcome: Progressing   Problem: Clinical Measurements: Goal: Ability to maintain clinical measurements within normal limits will improve Outcome: Progressing Goal: Will remain free from infection Outcome: Progressing Goal: Diagnostic test results will improve Outcome: Progressing Goal: Respiratory complications will improve Outcome: Progressing Goal: Cardiovascular complication will be avoided Outcome: Progressing   Problem: Activity: Goal: Risk for activity intolerance will decrease Outcome: Progressing   Problem: Nutrition: Goal: Adequate nutrition will be maintained Outcome: Progressing   Problem: Coping: Goal: Level of anxiety will decrease Outcome: Progressing   Problem: Elimination: Goal: Will not experience complications related to bowel motility Outcome: Progressing Goal: Will not experience complications related to urinary retention Outcome: Progressing   Problem: Pain Managment: Goal: General experience of comfort will improve and/or be controlled Outcome: Progressing   Problem: Safety: Goal: Ability to remain free from injury will improve Outcome: Progressing   Problem: Skin Integrity: Goal: Risk for impaired skin integrity will decrease Outcome: Progressing   Problem: Education: Goal: Knowledge of General Education information will improve Description: Including pain rating scale, medication(s)/side effects and non-pharmacologic comfort measures Outcome: Progressing   Problem: Health Behavior/Discharge Planning: Goal: Ability to manage health-related needs will improve Outcome: Progressing   Problem: Clinical Measurements: Goal:  Ability to maintain clinical measurements within normal limits will improve Outcome: Progressing Goal: Will remain free from infection Outcome: Progressing Goal: Diagnostic test results will improve Outcome: Progressing Goal: Respiratory complications will improve Outcome: Progressing Goal: Cardiovascular complication will be avoided Outcome: Progressing   Problem: Activity: Goal: Risk for activity intolerance will decrease Outcome: Progressing   Problem: Nutrition: Goal: Adequate nutrition will be maintained Outcome: Progressing   Problem: Coping: Goal: Level of anxiety will decrease Outcome: Progressing   Problem: Elimination: Goal: Will not experience complications related to bowel motility Outcome: Progressing Goal: Will not experience complications related to urinary retention Outcome: Progressing   Problem: Pain Managment: Goal: General experience of comfort will improve and/or be controlled Outcome: Progressing   Problem: Safety: Goal: Ability to remain free from injury will improve Outcome: Progressing   Problem: Skin Integrity: Goal: Risk for impaired skin integrity will decrease Outcome: Progressing   Problem: Education: Goal: Required Educational Video(s) Outcome: Progressing   Problem: Clinical Measurements: Goal: Ability to maintain clinical measurements within normal limits will improve Outcome: Progressing Goal: Postoperative complications will be avoided or minimized Outcome: Progressing   Problem: Skin Integrity: Goal: Demonstration of wound healing without infection will improve Outcome: Progressing

## 2024-04-16 NOTE — Anesthesia Preprocedure Evaluation (Addendum)
 Anesthesia Evaluation  Patient identified by MRN, date of birth, ID band Patient awake    Reviewed: Allergy & Precautions, NPO status , Patient's Chart, lab work & pertinent test results  Airway Mallampati: II  TM Distance: >3 FB Neck ROM: Full    Dental  (+) Missing, Dental Advisory Given,    Pulmonary Current Smoker and Patient abstained from smoking.   Pulmonary exam normal breath sounds clear to auscultation       Cardiovascular negative cardio ROS Normal cardiovascular exam Rhythm:Regular Rate:Normal     Neuro/Psych negative neurological ROS  negative psych ROS   GI/Hepatic Neg liver ROS,GERD  Medicated,,  Endo/Other  negative endocrine ROS    Renal/GU negative Renal ROS  negative genitourinary   Musculoskeletal negative musculoskeletal ROS (+)    Abdominal   Peds  Hematology negative hematology ROS (+)   Anesthesia Other Findings Underwent laparoscopic cholecystectomy on 04/15/2024.  Intraoperative cholangiogram positive, there with filling defects sitting in the distal hepatic duct.  Reproductive/Obstetrics                              Anesthesia Physical Anesthesia Plan  ASA: 2  Anesthesia Plan: General   Post-op Pain Management: Minimal or no pain anticipated   Induction: Intravenous  PONV Risk Score and Plan: 3 and Midazolam , Dexamethasone  and Ondansetron   Airway Management Planned: Oral ETT  Additional Equipment:   Intra-op Plan:   Post-operative Plan: Extubation in OR  Informed Consent: I have reviewed the patients History and Physical, chart, labs and discussed the procedure including the risks, benefits and alternatives for the proposed anesthesia with the patient or authorized representative who has indicated his/her understanding and acceptance.     Dental advisory given  Plan Discussed with: CRNA  Anesthesia Plan Comments:          Anesthesia  Quick Evaluation

## 2024-04-16 NOTE — Interval H&P Note (Signed)
 History and Physical Interval Note:  04/16/2024 11:13 AM  Amanda Tanner  has presented today for surgery, with the diagnosis of choledocholithiasis.  The various methods of treatment have been discussed with the patient and family. After consideration of risks, benefits and other options for treatment, the patient has consented to  Procedure(s): ERCP, WITH INTERVENTION IF INDICATED (N/A) as a surgical intervention.  The patient's history has been reviewed, patient examined, no change in status, stable for surgery.  I have reviewed the patient's chart and labs.  Questions were answered to the patient's satisfaction.     Lupita Commander

## 2024-04-16 NOTE — Progress Notes (Signed)
 Patient ID: Amanda Tanner, female   DOB: [birth year], 63 y.o.   MRN: [id number]   Acute Care Surgery Service Progress Note:    Chief Complaint/Subjective: Patient seen and examined earlier this morning. Some mild soreness but otherwise no complaints.  Patient is eager to get her ERCP done  Objective: Vital signs in last 24 hours: Temp:  [97.7 F (36.5 C)-98.1 F (36.7 C)] 98 F (36.7 C) (07/20 0944) Pulse Rate:  [63-75] 63 (07/20 0944) Resp:  [10-17] 16 (07/20 0944) BP: (113-160)/(50-71) 126/56 (07/20 0944) SpO2:  [95 %-100 %] 96 % (07/20 0944) Last BM Date : 04/14/24  Intake/Output from previous day: 07/19 0701 - 07/20 0700 In: 1541.9 [P.O.:360; I.V.:1181.9] Out: 400 [Urine:400] Intake/Output this shift: No intake/output data recorded.  Lungs: nonlabored  Cardiovascular: reg  Abd: Soft, appropriate tenderness, incisions clean dry intact, nondistended  Extremities: no edema, +SCDs  Neuro: alert, nonfocal  Lab Results: CBC  Recent Labs    04/15/24 0509 04/16/24 0733  WBC 7.9 12.0*  HGB 13.2 13.6  HCT 41.8 41.9  PLT 261 261   BMET Recent Labs    04/15/24 0509 04/16/24 0733  NA 139 139  K 3.8 3.9  CL 106 104  CO2 27 25  GLUCOSE 107* 97  BUN 16 14  CREATININE 0.70 0.67  CALCIUM 8.6* 8.9   LFT    Latest Ref Rng & Units 04/16/2024    7:33 AM 04/15/2024    5:09 AM 04/14/2024    2:09 PM  Hepatic Function  Total Protein 6.5 - 8.1 g/dL 6.1  5.9  6.6   Albumin 3.5 - 5.0 g/dL 3.2  3.1  3.6   AST 15 - 41 U/L 29  30  15    ALT 0 - 44 U/L 28  25  14    Alk Phosphatase 38 - 126 U/L 77  80  69   Total Bilirubin 0.0 - 1.2 mg/dL 0.6  1.0  0.5   Bilirubin, Direct 0.0 - 0.2 mg/dL <9.8      PT/INR No results for input(s): LABPROT, INR in the last 72 hours. ABG No results for input(s): PHART, HCO3 in the last 72 hours.  Invalid input(s): PCO2, PO2  Studies/Results:  Anti-infectives: Anti-infectives (From admission, onward)    Start     Dose/Rate  Route Frequency Ordered Stop   04/14/24 1800  ceFAZolin  (ANCEF ) IVPB 2g/100 mL premix        2 g 200 mL/hr over 30 Minutes Intravenous On call to O.R. 04/14/24 1658 04/15/24 0559   04/14/24 1700  ceFAZolin  (ANCEF ) IVPB 2g/100 mL premix  Status:  Discontinued        2 g 200 mL/hr over 30 Minutes Intravenous  Once 04/14/24 1649 04/14/24 1658       Medications: Scheduled Meds:  [MAR Hold] acetaminophen   1,000 mg Oral Q6H   [MAR Hold] enoxaparin  (LOVENOX ) injection  40 mg Subcutaneous Q24H   [MAR Hold] nicotine   7 mg Transdermal Daily   [MAR Hold] pantoprazole   40 mg Oral Daily   Continuous Infusions: PRN Meds:.[MAR Hold] acetaminophen  **OR** [MAR Hold] acetaminophen , [MAR Hold] diphenhydrAMINE  **OR** [MAR Hold] diphenhydrAMINE , [MAR Hold]  HYDROmorphone  (DILAUDID ) injection, [MAR Hold] ondansetron  (ZOFRAN ) IV, [MAR Hold] oxyCODONE , [MAR Hold] simethicone   Assessment/Plan: Patient Active Problem List   Diagnosis Date Noted   Acute cholecystitis 04/14/2024   Status post laparoscopic cholecystectomy with intraoperative cholangiogram 04/15/2024 by Dr. Lyndel   -Doing well.  Choledocholithiasis-for ERCP later this morning    Disposition: Probably  home Monday assuming no issues after ERCP  LOS: 0 days    Camellia HERO. Tanda, MD, FACS General, Bariatric, & Minimally Invasive Surgery [phone] [facility]

## 2024-04-17 ENCOUNTER — Encounter (HOSPITAL_COMMUNITY): Payer: Self-pay | Admitting: Surgery

## 2024-04-17 LAB — BASIC METABOLIC PANEL WITH GFR
Anion gap: 8 (ref 5–15)
BUN: 14 mg/dL (ref 8–23)
CO2: 26 mmol/L (ref 22–32)
Calcium: 8.6 mg/dL — ABNORMAL LOW (ref 8.9–10.3)
Chloride: 105 mmol/L (ref 98–111)
Creatinine, Ser: 0.7 mg/dL (ref 0.44–1.00)
GFR, Estimated: >60 mL/min (ref >60)
Glucose, Bld: 91 mg/dL (ref 70–99)
Potassium: 3.6 mmol/L (ref 3.5–5.1)
Sodium: 139 mmol/L (ref 135–145)

## 2024-04-17 LAB — HEPATIC FUNCTION PANEL
ALT: 154 U/L — ABNORMAL HIGH (ref 0–44)
AST: 312 U/L — ABNORMAL HIGH (ref 15–41)
Albumin: 3 g/dL — ABNORMAL LOW (ref 3.5–5.0)
Alkaline Phosphatase: 136 U/L — ABNORMAL HIGH (ref 38–126)
Bilirubin, Direct: 0.3 mg/dL — ABNORMAL HIGH (ref 0.0–0.2)
Indirect Bilirubin: 0.7 mg/dL (ref 0.3–0.9)
Total Bilirubin: 1 mg/dL (ref 0.0–1.2)
Total Protein: 5.7 g/dL — ABNORMAL LOW (ref 6.5–8.1)

## 2024-04-17 LAB — CBC
HCT: 37.9 % (ref 36.0–46.0)
Hemoglobin: 12.1 g/dL (ref 12.0–15.0)
MCH: 31.5 pg (ref 26.0–34.0)
MCHC: 31.9 g/dL (ref 30.0–36.0)
MCV: 98.7 fL (ref 80.0–100.0)
Platelets: 256 K/uL (ref 150–400)
RBC: 3.84 MIL/uL — ABNORMAL LOW (ref 3.87–5.11)
RDW: 12.4 % (ref 11.5–15.5)
WBC: 8.5 K/uL (ref 4.0–10.5)
nRBC: 0 % (ref 0.0–0.2)

## 2024-04-17 LAB — LIPASE, BLOOD: Lipase: 27 U/L (ref 11–51)

## 2024-04-17 NOTE — Progress Notes (Signed)
 Progress Note  1 Day Post-Op  Subjective: Patient reports general soreness. No significant pain. Tolerating liquids well and would like to advance to soft diet. Denies nausea and vomiting.   Patient reports that she works at [employer]. Her responsibilities often require her to lift heavy objects and to stand for long periods of time.  ROS   Objective: Vital signs in last 24 hours: Temp:  [97.4 F (36.3 C)-98.2 F (36.8 C)] 98.2 F (36.8 C) (07/20 2032) Pulse Rate:  [65-102] 72 (07/21 0535) Resp:  [12-19] 16 (07/21 0535) BP: (117-144)/(43-76) 133/72 (07/21 0535) SpO2:  [96 %-100 %] 97 % (07/21 0535) Last BM Date : 04/14/24  Intake/Output from previous day: 07/20 0701 - 07/21 0700 In: 1770 [P.O.:1370; I.V.:400] Out: -  Intake/Output this shift: Total I/O In: 240 [P.O.:240] Out: -   PE: General: pleasant, WD, female who is laying in bed in NAD. Respiratory effort nonlabored Abd: Soft and nontender to palpation. Nondistended. No rebounding or guarding. Incision C/D/I Psych: A&Ox3 with an appropriate affect.    Lab Results:  Recent Labs    04/16/24 0733 04/17/24 0439  WBC 12.0* 8.5  HGB 13.6 12.1  HCT 41.9 37.9  PLT 261 256   BMET Recent Labs    04/16/24 0733 04/17/24 0439  NA 139 139  K 3.9 3.6  CL 104 105  CO2 25 26  GLUCOSE 97 91  BUN 14 14  CREATININE 0.67 0.70  CALCIUM 8.9 8.6*   PT/INR No results for input(s): LABPROT, INR in the last 72 hours. CMP     Component Value Date/Time   NA 139 04/17/2024 0439   K 3.6 04/17/2024 0439   CL 105 04/17/2024 0439   CO2 26 04/17/2024 0439   GLUCOSE 91 04/17/2024 0439   BUN 14 04/17/2024 0439   CREATININE 0.70 04/17/2024 0439   CREATININE 0.90 01/28/2024 0808   CALCIUM 8.6 (L) 04/17/2024 0439   PROT 5.7 (L) 04/17/2024 0439   ALBUMIN 3.0 (L) 04/17/2024 0439   AST 312 (H) 04/17/2024 0439   ALT 154 (H) 04/17/2024 0439   ALKPHOS 136 (H) 04/17/2024 0439   BILITOT 1.0 04/17/2024 0439    GFRNONAA >60 04/17/2024 0439   Lipase     Component Value Date/Time   LIPASE 27 04/17/2024 0439       Studies/Results: DG ERCP Result Date: 04/17/2024 CLINICAL DATA:  Filling defect on intraoperative cholangiogram EXAM: ERCP 3 images TECHNIQUE: Multiple spot images obtained with the fluoroscopic device and submitted for interpretation post-procedure. FLUOROSCOPY: Radiation Exposure Index (as provided by the fluoroscopic device): 3 minutes 18 seconds 36.89 mGy Kerma COMPARISON:  None FINDINGS: Three fluoroscopic intra procedural images of the right upper quadrant were submitted for interpretation. Flexible endoscopy with guidewire in mildly dilated common bile duct. No stricture or filling defects. Surgical clips consistent with previous cholecystectomy. No contrast identified in the cystic duct and no contrast extravasation. Oral contrast identified in the colon. IMPRESSION: No common bile duct filling defects and no extravasation of contrast seen. These images were submitted for radiologic interpretation only. Please see the procedural report for the amount of contrast and the fluoroscopy time utilized. Electronically Signed   By: Cordella Banner   On: 04/17/2024 08:50   DG C-Arm 1-60 Min-No Report Result Date: 04/16/2024 Fluoroscopy was utilized by the requesting physician.  No radiographic interpretation.   DG Cholangiogram Operative Result Date: 04/15/2024 CLINICAL DATA:  Intraoperative cholangiogram status post cholecystectomy. EXAM: INTRAOPERATIVE CHOLANGIOGRAM TECHNIQUE: Cholangiographic images from the  C-arm fluoroscopic device were submitted for interpretation post-operatively. Please see the procedural report for the amount of contrast and the fluoroscopy time utilized. FLUOROSCOPY: Radiation Exposure Index (as provided by the fluoroscopic device): 1.6 mGy Kerma COMPARISON:  Right upper quadrant sonogram 04/13/2024 FINDINGS: T-Tube cholangiogram was performed. Contrast injection  opacifies the common bile duct. Signs intrahepatic bile duct dilatation noted. Multiple filling defects are identified within the proximal common bile duct for which underlying choledocholithiasis is suspected. No contrast extravasation identified to suggest bile leak. IMPRESSION: 1. Intraoperative cholangiogram demonstrates multiple filling defects within the proximal common bile duct for which underlying choledocholithiasis is suspected. 2. No contrast extravasation identified to suggest bile leak. Electronically Signed   By: Waddell Calk M.D.   On: 04/15/2024 13:08    Anti-infectives: Anti-infectives (From admission, onward)    Start     Dose/Rate Route Frequency Ordered Stop   04/14/24 1800  ceFAZolin  (ANCEF ) IVPB 2g/100 mL premix        2 g 200 mL/hr over 30 Minutes Intravenous On call to O.R. 04/14/24 1658 04/15/24 0559   04/14/24 1700  ceFAZolin  (ANCEF ) IVPB 2g/100 mL premix  Status:  Discontinued        2 g 200 mL/hr over 30 Minutes Intravenous  Once 04/14/24 1649 04/14/24 1658        Assessment/Plan POD2: S/P laparoscopic cholecystectomy with intraoperative cholangiogram due to cholecystitis with Dr. Lyndel on 04/15/2024 -ERCP from 04/16/2024 showed no common bile duct filling defects and no extravasation of contrast seen. -WBC 8.5 from 12.0 (7/20) -AST 312 from 29 (7/20); ALT 154 from 28 (7/20); Alkaline phosphatase 136 from 77 (7/20); Will plan to have LFT's reassessed. Based on these labs, will plan for discharge. -Lipase 27 from 39 (7/18) -Continue to advance to soft diet as tolerated. -Discussed in detail postoperative expectations and restrictions.     FEN: Diet soft fluid consistency VTE: Enoxaparin  injection ID: None    LOS: 0 days     Marjorie Carlyon Favre, [facility] 04/17/2024, 10:55 AM Please see Amion for pager number during day hours 7:00am-4:30pm

## 2024-04-17 NOTE — Anesthesia Postprocedure Evaluation (Signed)
 Anesthesia Post Note  Patient: Taeko Schaffer  Procedure(s) Performed: LAPAROSCOPIC CHOLECYSTECTOMY WITH INTRAOPERATIVE CHOLANGIOGRAM (Abdomen)     Patient location during evaluation: PACU Anesthesia Type: General Level of consciousness: awake and alert Pain management: pain level controlled Vital Signs Assessment: post-procedure vital signs reviewed and stable Respiratory status: spontaneous breathing, nonlabored ventilation, respiratory function stable and patient connected to nasal cannula oxygen Cardiovascular status: blood pressure returned to baseline and stable Postop Assessment: no apparent nausea or vomiting Anesthetic complications: no   No notable events documented.  Last Vitals:  Vitals:   04/16/24 2032 04/17/24 0535  BP: 127/76 133/72  Pulse: 69 72  Resp: 16 16  Temp: 36.8 C   SpO2: 96% 97%    Last Pain:  Vitals:   04/17/24 0533  TempSrc:   PainSc: 7                  Leydi Winstead S

## 2024-04-17 NOTE — Discharge Instructions (Signed)
 LAPAROSCOPIC SURGERY: POST OP INSTRUCTIONS Always review your discharge instruction sheet given to you by the facility where your surgery was performed. IF YOU HAVE DISABILITY OR FAMILY LEAVE FORMS, YOU MUST BRING THEM TO THE OFFICE FOR PROCESSING.   DO NOT GIVE THEM TO YOUR DOCTOR.  PAIN CONTROL  First take acetaminophen (Tylenol) AND/or ibuprofen (Advil) to control your pain after surgery.  Follow directions on package.  Taking acetaminophen (Tylenol) and/or ibuprofen (Advil) regularly after surgery will help to control your pain and lower the amount of prescription pain medication you may need.  You should not take more than 3,000 mg (3 grams) of acetaminophen (Tylenol) in 24 hours.  You should not take ibuprofen (Advil), aleve, motrin, naprosyn or other NSAIDS if you have a history of stomach ulcers or chronic kidney disease.  A prescription for pain medication may be given to you upon discharge.  Take your pain medication as prescribed, if you still have uncontrolled pain after taking acetaminophen (Tylenol) or ibuprofen (Advil). Use ice packs to help control pain. If you need a refill on your pain medication, please contact your pharmacy.  They will contact our office to request authorization. Prescriptions will not be filled after 5pm or on week-ends.  HOME MEDICATIONS Take your usually prescribed medications unless otherwise directed.  DIET You should follow a light diet the first few days after arrival home.  Be sure to include lots of fluids daily. Avoid fatty, fried foods.   CONSTIPATION It is common to experience some constipation after surgery and if you are taking pain medication.  Increasing fluid intake and taking a stool softener (such as Colace) will usually help or prevent this problem from occurring.  A mild laxative (Milk of Magnesia or Miralax) should be taken according to package instructions if there are no bowel movements after 48 hours.  WOUND/INCISION CARE Most  patients will experience some swelling and bruising in the area of the incisions.  Ice packs will help.  Swelling and bruising can take several days to resolve.  Unless discharge instructions indicate otherwise, follow guidelines below  STERI-STRIPS - you may remove your outer bandages 48 hours after surgery, and you may shower at that time.  You have steri-strips (small skin tapes) in place directly over the incision.  These strips should be left on the skin for 7-10 days.   DERMABOND/SKIN GLUE - you may shower in 24 hours.  The glue will flake off over the next 2-3 weeks. Any sutures or staples will be removed at the office during your follow-up visit.  ACTIVITIES You may resume regular (light) daily activities beginning the next day--such as daily self-care, walking, climbing stairs--gradually increasing activities as tolerated.  You may have sexual intercourse when it is comfortable.  Refrain from any heavy lifting or straining until approved by your doctor. You may drive when you are no longer taking prescription pain medication, you can comfortably wear a seatbelt, and you can safely maneuver your car and apply brakes.  FOLLOW-UP You should see your doctor in the office for a follow-up appointment approximately 2-3 weeks after your surgery.  You should have been given your post-op/follow-up appointment when your surgery was scheduled.  If you did not receive a post-op/follow-up appointment, make sure that you call for this appointment within a day or two after you arrive home to insure a convenient appointment time.  OTHER INSTRUCTIONS   WHEN TO CALL YOUR DOCTOR: Fever over 101.0 Inability to urinate Continued bleeding from incision. Increased pain,  redness, or drainage from the incision. Increasing abdominal pain  The clinic staff is available to answer your questions during regular business hours.  Please don't hesitate to call and ask to speak to one of the nurses for clinical  concerns.  If you have a medical emergency, go to the nearest emergency room or call 911.  A surgeon from [facility] is always on call at the hospital. [address], [place], [place]  [address] ? [address] [address], [place], [place]   [address] [phone] ? [phone] ? FAX [phone] Web site: [email or web address]

## 2024-04-17 NOTE — Progress Notes (Signed)
 Subjective: No abdominal pain. Is hungry.  Objective: Vital signs in last 24 hours: Temp:  [97.4 F (36.3 C)-98.2 F (36.8 C)] 98.2 F (36.8 C) (07/20 2032) Pulse Rate:  [65-102] 72 (07/21 0535) Resp:  [12-19] 16 (07/21 0535) BP: (117-144)/(43-76) 133/72 (07/21 0535) SpO2:  [96 %-100 %] 97 % (07/21 0535) Weight change:  Last BM Date : 04/14/24  PE: GEN:  NAD ABD:  Soft, non-tender NEURO:  No encephalopathy  Lab Results: CBC    Component Value Date/Time   WBC 8.5 04/17/2024 0439   RBC 3.84 (L) 04/17/2024 0439   HGB 12.1 04/17/2024 0439   HCT 37.9 04/17/2024 0439   PLT 256 04/17/2024 0439   MCV 98.7 04/17/2024 0439   MCV 99.8 (A) 01/09/2014 1450   MCH 31.5 04/17/2024 0439   MCHC 31.9 04/17/2024 0439   RDW 12.4 04/17/2024 0439   EOSABS 110 01/28/2024 0808   BASOSABS 48 01/28/2024 0808  CMP     Component Value Date/Time   NA 139 04/17/2024 0439   K 3.6 04/17/2024 0439   CL 105 04/17/2024 0439   CO2 26 04/17/2024 0439   GLUCOSE 91 04/17/2024 0439   BUN 14 04/17/2024 0439   CREATININE 0.70 04/17/2024 0439   CREATININE 0.90 01/28/2024 0808   CALCIUM 8.6 (L) 04/17/2024 0439   PROT 5.7 (L) 04/17/2024 0439   ALBUMIN 3.0 (L) 04/17/2024 0439   AST 312 (H) 04/17/2024 0439   ALT 154 (H) 04/17/2024 0439   ALKPHOS 136 (H) 04/17/2024 0439   BILITOT 1.0 04/17/2024 0439   GFRNONAA >60 04/17/2024 0439   Assessment:   Positive IOC (stone) with subsequent ERCP (sphincterotomy, sludge extracted, no stones). Elevated LFTs.  New onset today.  Possibly ampullary edema post-ERCP.  Plan:   Advance to soft, low fat diet. Follow LFTs. Imagine patient can go home in next day or two. [facility] will follow.   Amanda Tanner 04/17/2024, 11:03 AM   Cell [phone] If no answer or after 5 PM call [phone]

## 2024-04-17 NOTE — Plan of Care (Signed)
  Problem: Education: Goal: Knowledge of General Education information will improve Description: Including pain rating scale, medication(s)/side effects and non-pharmacologic comfort measures Outcome: Progressing   Problem: Health Behavior/Discharge Planning: Goal: Ability to manage health-related needs will improve Outcome: Progressing   Problem: Clinical Measurements: Goal: Ability to maintain clinical measurements within normal limits will improve Outcome: Progressing Goal: Will remain free from infection Outcome: Progressing Goal: Diagnostic test results will improve Outcome: Progressing Goal: Respiratory complications will improve Outcome: Progressing Goal: Cardiovascular complication will be avoided Outcome: Progressing   Problem: Activity: Goal: Risk for activity intolerance will decrease Outcome: Progressing   Problem: Nutrition: Goal: Adequate nutrition will be maintained Outcome: Progressing   Problem: Coping: Goal: Level of anxiety will decrease Outcome: Progressing   Problem: Elimination: Goal: Will not experience complications related to bowel motility Outcome: Progressing Goal: Will not experience complications related to urinary retention Outcome: Progressing   Problem: Pain Managment: Goal: General experience of comfort will improve and/or be controlled Outcome: Progressing   Problem: Safety: Goal: Ability to remain free from injury will improve Outcome: Progressing   Problem: Skin Integrity: Goal: Risk for impaired skin integrity will decrease Outcome: Progressing   Problem: Education: Goal: Knowledge of General Education information will improve Description: Including pain rating scale, medication(s)/side effects and non-pharmacologic comfort measures Outcome: Progressing   Problem: Health Behavior/Discharge Planning: Goal: Ability to manage health-related needs will improve Outcome: Progressing   Problem: Clinical Measurements: Goal:  Ability to maintain clinical measurements within normal limits will improve Outcome: Progressing Goal: Will remain free from infection Outcome: Progressing Goal: Diagnostic test results will improve Outcome: Progressing Goal: Respiratory complications will improve Outcome: Progressing Goal: Cardiovascular complication will be avoided Outcome: Progressing   Problem: Activity: Goal: Risk for activity intolerance will decrease Outcome: Progressing   Problem: Nutrition: Goal: Adequate nutrition will be maintained Outcome: Progressing   Problem: Coping: Goal: Level of anxiety will decrease Outcome: Progressing   Problem: Elimination: Goal: Will not experience complications related to bowel motility Outcome: Progressing Goal: Will not experience complications related to urinary retention Outcome: Progressing   Problem: Pain Managment: Goal: General experience of comfort will improve and/or be controlled Outcome: Progressing   Problem: Safety: Goal: Ability to remain free from injury will improve Outcome: Progressing   Problem: Skin Integrity: Goal: Risk for impaired skin integrity will decrease Outcome: Progressing   Problem: Education: Goal: Required Educational Video(s) Outcome: Progressing   Problem: Clinical Measurements: Goal: Ability to maintain clinical measurements within normal limits will improve Outcome: Progressing Goal: Postoperative complications will be avoided or minimized Outcome: Progressing   Problem: Skin Integrity: Goal: Demonstration of wound healing without infection will improve Outcome: Progressing

## 2024-04-18 ENCOUNTER — Other Ambulatory Visit (HOSPITAL_COMMUNITY): Payer: Self-pay

## 2024-04-18 LAB — COMPREHENSIVE METABOLIC PANEL WITH GFR
ALT: 112 U/L — ABNORMAL HIGH (ref 0–44)
AST: 82 U/L — ABNORMAL HIGH (ref 15–41)
Albumin: 3 g/dL — ABNORMAL LOW (ref 3.5–5.0)
Alkaline Phosphatase: 112 U/L (ref 38–126)
Anion gap: 8 (ref 5–15)
BUN: 18 mg/dL (ref 8–23)
CO2: 31 mmol/L (ref 22–32)
Calcium: 8.5 mg/dL — ABNORMAL LOW (ref 8.9–10.3)
Chloride: 101 mmol/L (ref 98–111)
Creatinine, Ser: 0.94 mg/dL (ref 0.44–1.00)
GFR, Estimated: >60 mL/min (ref >60)
Glucose, Bld: 92 mg/dL (ref 70–99)
Potassium: 3.5 mmol/L (ref 3.5–5.1)
Sodium: 140 mmol/L (ref 135–145)
Total Bilirubin: 0.6 mg/dL (ref 0.0–1.2)
Total Protein: 5.8 g/dL — ABNORMAL LOW (ref 6.5–8.1)

## 2024-04-18 LAB — CBC
HCT: 41.2 % (ref 36.0–46.0)
Hemoglobin: 13 g/dL (ref 12.0–15.0)
MCH: 31.6 pg (ref 26.0–34.0)
MCHC: 31.6 g/dL (ref 30.0–36.0)
MCV: 100 fL (ref 80.0–100.0)
Platelets: 286 K/uL (ref 150–400)
RBC: 4.12 MIL/uL (ref 3.87–5.11)
RDW: 12.3 % (ref 11.5–15.5)
WBC: 7.2 K/uL (ref 4.0–10.5)
nRBC: 0 % (ref 0.0–0.2)

## 2024-04-18 MED ORDER — ACETAMINOPHEN 500 MG PO TABS
1000.0000 mg | ORAL_TABLET | Freq: Four times a day (QID) | ORAL | 0 refills | Status: AC
  Filled 2024-04-18: qty 30, 4d supply, fill #0

## 2024-04-18 MED ORDER — OXYCODONE HCL 5 MG PO TABS
5.0000 mg | ORAL_TABLET | Freq: Four times a day (QID) | ORAL | 0 refills | Status: AC | PRN
  Filled 2024-04-18 (×2): qty 15, 3d supply, fill #0

## 2024-04-18 NOTE — Progress Notes (Signed)
 Subjective: No abdominal pain. Tolerating diet.  Objective: Vital signs in last 24 hours: Temp:  [98 F (36.7 C)-98.5 F (36.9 C)] 98 F (36.7 C) (07/22 0500) Pulse Rate:  [59-78] 59 (07/22 0500) Resp:  [16] 16 (07/22 0500) BP: (104-134)/(48-67) 134/67 (07/22 0500) SpO2:  [96 %-98 %] 96 % (07/22 0500) Weight change:  Last BM Date : 04/14/24  PE: GEN:  NAD ABD:  Soft, non-tender NEURO:  No encephalopathy  Lab Results: CBC    Component Value Date/Time   WBC 7.2 04/18/2024 0455   RBC 4.12 04/18/2024 0455   HGB 13.0 04/18/2024 0455   HCT 41.2 04/18/2024 0455   PLT 286 04/18/2024 0455   MCV 100.0 04/18/2024 0455   MCV 99.8 (A) 01/09/2014 1450   MCH 31.6 04/18/2024 0455   MCHC 31.6 04/18/2024 0455   RDW 12.3 04/18/2024 0455   EOSABS 110 01/28/2024 0808   BASOSABS 48 01/28/2024 0808  CMP     Component Value Date/Time   NA 140 04/18/2024 0455   K 3.5 04/18/2024 0455   CL 101 04/18/2024 0455   CO2 31 04/18/2024 0455   GLUCOSE 92 04/18/2024 0455   BUN 18 04/18/2024 0455   CREATININE 0.94 04/18/2024 0455   CREATININE 0.90 01/28/2024 0808   CALCIUM 8.5 (L) 04/18/2024 0455   PROT 5.8 (L) 04/18/2024 0455   ALBUMIN 3.0 (L) 04/18/2024 0455   AST 82 (H) 04/18/2024 0455   ALT 112 (H) 04/18/2024 0455   ALKPHOS 112 04/18/2024 0455   BILITOT 0.6 04/18/2024 0455   GFRNONAA >60 04/18/2024 0455    Assessment:   Positive IOC (stone) with subsequent ERCP (sphincterotomy, sludge extracted, no stones). Elevated LFTs.  Improving; I have spoken with Dr. Avram and he feels this likely from biliary ductal manipulation during ERCP procedure.  Plan:   Advance diet slowly as tolerated. OK to discharge patient home from GI perspective.  Her PCP will need to follow-up LFTs as outpatient.  Patient can follow-up with us  in GI on as-needed basis. [facility] will sign-off; please call with questions; thank you for the consultation.   BURNETTE ELSIE HERO 04/18/2024, 1:40 PM   Cell  [phone] If no answer or after 5 PM call [phone]

## 2024-04-18 NOTE — Progress Notes (Signed)
 TOC meds in a secure bag delivered to patient in room  by this RN

## 2024-04-18 NOTE — Progress Notes (Signed)
 Pt was discharged home today. Instructions were reviewed with patient,medications were delivered to patient and all questions were answered. Pt was taken to main entrance via wheelchair by NT.

## 2024-04-18 NOTE — Plan of Care (Signed)

## 2024-04-18 NOTE — Plan of Care (Signed)
   Problem: Nutrition: Goal: Adequate nutrition will be maintained Outcome: Progressing

## 2024-04-18 NOTE — Progress Notes (Signed)
 Progress Note  2 Days Post-Op  Subjective: Patient feels that she continues to improve overall. She reports generalized abdominal soreness. Denies significant pain. Patient has been tolerating soft diet. Denies nausea or vomiting after intake. She has not had a bowel movement. Reports flatulence. Denies any new concerns.   ROS  All negative with the exception of above.  Objective: Vital signs in last 24 hours: Temp:  [98 F (36.7 C)-98.5 F (36.9 C)] 98 F (36.7 C) (07/22 0500) Pulse Rate:  [59-78] 59 (07/22 0500) Resp:  [16] 16 (07/22 0500) BP: (104-134)/(48-67) 134/67 (07/22 0500) SpO2:  [96 %-98 %] 96 % (07/22 0500) Last BM Date : 04/14/24  Intake/Output from previous day: 07/21 0701 - 07/22 0700 In: 1200 [P.O.:1200] Out: -  Intake/Output this shift: Total I/O In: 240 [P.O.:240] Out: -   PE: General: Pleasant, WD, female who is laying in bed in NAD. Lungs: Respiratory effort nonlabored Abd: Soft and nontendern to palpation. Nondistended. Incisions C/D/I. No rebounding or guarding. Psych: A&Ox3 with an appropriate affect.    Lab Results:  Recent Labs    04/17/24 0439 04/18/24 0455  WBC 8.5 7.2  HGB 12.1 13.0  HCT 37.9 41.2  PLT 256 286   BMET Recent Labs    04/17/24 0439 04/18/24 0455  NA 139 140  K 3.6 3.5  CL 105 101  CO2 26 31  GLUCOSE 91 92  BUN 14 18  CREATININE 0.70 0.94  CALCIUM 8.6* 8.5*   PT/INR No results for input(s): LABPROT, INR in the last 72 hours. CMP     Component Value Date/Time   NA 140 04/18/2024 0455   K 3.5 04/18/2024 0455   CL 101 04/18/2024 0455   CO2 31 04/18/2024 0455   GLUCOSE 92 04/18/2024 0455   BUN 18 04/18/2024 0455   CREATININE 0.94 04/18/2024 0455   CREATININE 0.90 01/28/2024 0808   CALCIUM 8.5 (L) 04/18/2024 0455   PROT 5.8 (L) 04/18/2024 0455   ALBUMIN 3.0 (L) 04/18/2024 0455   AST 82 (H) 04/18/2024 0455   ALT 112 (H) 04/18/2024 0455   ALKPHOS 112 04/18/2024 0455   BILITOT 0.6 04/18/2024 0455    GFRNONAA >60 04/18/2024 0455   Lipase     Component Value Date/Time   LIPASE 27 04/17/2024 0439       Studies/Results: DG ERCP Result Date: 04/17/2024 CLINICAL DATA:  Filling defect on intraoperative cholangiogram EXAM: ERCP 3 images TECHNIQUE: Multiple spot images obtained with the fluoroscopic device and submitted for interpretation post-procedure. FLUOROSCOPY: Radiation Exposure Index (as provided by the fluoroscopic device): 3 minutes 18 seconds 36.89 mGy Kerma COMPARISON:  None FINDINGS: Three fluoroscopic intra procedural images of the right upper quadrant were submitted for interpretation. Flexible endoscopy with guidewire in mildly dilated common bile duct. No stricture or filling defects. Surgical clips consistent with previous cholecystectomy. No contrast identified in the cystic duct and no contrast extravasation. Oral contrast identified in the colon. IMPRESSION: No common bile duct filling defects and no extravasation of contrast seen. These images were submitted for radiologic interpretation only. Please see the procedural report for the amount of contrast and the fluoroscopy time utilized. Electronically Signed   By: Cordella Banner   On: 04/17/2024 08:50   DG C-Arm 1-60 Min-No Report Result Date: 04/16/2024 Fluoroscopy was utilized by the requesting physician.  No radiographic interpretation.    Anti-infectives: Anti-infectives (From admission, onward)    Start     Dose/Rate Route Frequency Ordered Stop   04/14/24 1800  ceFAZolin  (ANCEF ) IVPB 2g/100 mL premix        2 g 200 mL/hr over 30 Minutes Intravenous On call to O.R. 04/14/24 1658 04/15/24 0559   04/14/24 1700  ceFAZolin  (ANCEF ) IVPB 2g/100 mL premix  Status:  Discontinued        2 g 200 mL/hr over 30 Minutes Intravenous  Once 04/14/24 1649 04/14/24 1658        Assessment/Plan POD3: S/P laparoscopic cholecystectomy with intraoperative cholangiogram due to cholecystitis with Dr. Lyndel on  04/15/2024 -ERCP from 04/16/2024 showed no common bile duct filling defects and no extravasation of contrast seen. -WBC 7.2 from 8.5 (7/21) -AST 82 from 312 (7/21); ALT 112 from 154 (7/21); Alkaline phosphatase 112 from 136 (7/21); Recommend having these assessed as outpatient. -Lipase 27 on 7/21 from 39 (7/18) -Tolerated soft diet. -Discussed in detail postoperative expectations and restrictions. -Stable for discharge  from general surgery standpoint.        FEN: Diet soft  VTE: Enoxaparin  injection ID: None    LOS: 0 days    Marjorie Carlyon Favre, [facility] 04/18/2024, 9:05 AM Please see Amion for pager number during day hours 7:00am-4:30pm

## 2024-04-19 LAB — SURGICAL PATHOLOGY

## 2024-04-21 NOTE — Discharge Summary (Signed)
 [facility] Discharge Summary   Patient ID: Amanda Tanner MRN: [id number] DOB/AGE: 63-Sep-1962 64 y.o.  Admit date: 04/14/2024 Discharge date: 04/18/2024  Admitting Diagnosis: Cholecystitis  Discharge Diagnosis Patient Active Problem List   Diagnosis Date Noted   Abnormal cholangiogram 04/16/2024   Acute cholecystitis 04/14/2024    Consultants General surgery Gasteroenterology  Imaging: 04/13/2024 RUQ US  Abdomen Limited  04/15/2024 DG Cholangiogram Operative 04/16/2024 DG C-Arm 1-60 Min-No Report 04/16/2024 DG ERCP  Procedures Dr. Deward Stechschulte (04/15/24) - Laparoscopic Cholecystectomy with IOC Dr. Lupita Commander (04/16/2024) - ERCP  Hospital Course:  Amanda Tanner is a 63 year old female who presented to ED after found to have an abnormal gallbladder ultrasound. Patient reported that prior to the ultrasound she had experienced vomiting, diarrhea, and upper abdominal pain. She went to her doctor and had an ultrasound. Workup showed concerns of acute cholecystitis.  Patient was admitted and underwent procedures listed above.  Tolerated procedure well and was transferred to the floor.  Diet was advanced as tolerated.  On POD 3, the patient was voiding well, tolerating diet, ambulating well, pain well controlled, vital signs stable, incisions c/d/i and felt stable for discharge home.  Patient will follow up in our office as below. Patient aware to call with questions or concerns.  She will call to confirm appointment date/time.    Physical Exam: General:  Alert, NAD, pleasant, comfortable Lungs: Respiratory effort nonlabored  Abd: Soft and nontendern to palpation. Nondistended. Incisions C/D/I. No rebounding or guarding.  Psych: A&Ox3 with an appropriate affect.   I or a member of my team have reviewed this patient in the Controlled Substance Database.   Allergies as of 04/18/2024   No Known Allergies      Medication List     TAKE these medications     Acetaminophen  Extra Strength 500 MG Tabs Take 2 tablets (1,000 mg total) by mouth every 6 (six) hours.   bismuth subsalicylate 262 MG chewable tablet Commonly known as: PEPTO BISMOL Chew 4 tablets by mouth as needed for indigestion or diarrhea or loose stools.   esomeprazole 20 MG capsule Commonly known as: NEXIUM Take 20 mg by mouth as needed.   multivitamin tablet Take 1 tablet by mouth daily.   naproxen sodium 220 MG tablet Commonly known as: ALEVE Take 220 mg by mouth as needed.   nitroGLYCERIN  0.2 mg/hr patch Commonly known as: NITRODUR - Dosed in mg/24 hr Use 1/4 patch daily to the affected area.   ondansetron  8 MG disintegrating tablet Commonly known as: ZOFRAN -ODT Take 8 mg by mouth as needed for nausea or vomiting.   oxyCODONE  5 MG immediate release tablet Commonly known as: Oxy IR/ROXICODONE  Take 1-2 tablets (5-10 mg total) by mouth every 6 (six) hours as needed for moderate pain (pain score 4-6).          Follow-up Information     Maczis, Puja Gosai, PA-C Follow up.   Specialty: General Surgery Why: our office is scheduling you for post-operative follow up in 3-4 weeks. call to confirm appointment date/time. Contact information: [address] [place] [address] [phone]                 Signed: Marjorie Carlyon Edmundo DEVONNA [facility] 04/21/2024, 12:05 PM Please see Amion for pager number during day hours 7:00am-4:30pm
